# Patient Record
Sex: Male | Born: 1950 | Race: White | Hispanic: No | Marital: Married | Smoking: Current every day smoker
Health system: Southern US, Community
[De-identification: ages and names within clinical notes are randomized; demographics above are authoritative.]

## PROBLEM LIST (undated history)

## (undated) DIAGNOSIS — N529 Male erectile dysfunction, unspecified: Secondary | ICD-10-CM

## (undated) DIAGNOSIS — I2510 Atherosclerotic heart disease of native coronary artery without angina pectoris: Secondary | ICD-10-CM

## (undated) DIAGNOSIS — F419 Anxiety disorder, unspecified: Secondary | ICD-10-CM

## (undated) DIAGNOSIS — Z720 Tobacco use: Secondary | ICD-10-CM

## (undated) DIAGNOSIS — E785 Hyperlipidemia, unspecified: Secondary | ICD-10-CM

## (undated) DIAGNOSIS — N189 Chronic kidney disease, unspecified: Secondary | ICD-10-CM

## (undated) DIAGNOSIS — I10 Essential (primary) hypertension: Secondary | ICD-10-CM

## (undated) HISTORY — DX: Atherosclerotic heart disease of native coronary artery without angina pectoris: I25.10

## (undated) HISTORY — DX: Chronic kidney disease, unspecified: N18.9

## (undated) HISTORY — DX: Essential (primary) hypertension: I10

## (undated) HISTORY — DX: Male erectile dysfunction, unspecified: N52.9

## (undated) HISTORY — DX: Anxiety disorder, unspecified: F41.9

## (undated) HISTORY — DX: Hyperlipidemia, unspecified: E78.5

## (undated) HISTORY — DX: Tobacco use: Z72.0

---

## 1999-09-07 ENCOUNTER — Emergency Department (HOSPITAL_COMMUNITY): Admission: EM | Admit: 1999-09-07 | Discharge: 1999-09-07 | Payer: Self-pay | Admitting: Internal Medicine

## 2001-02-15 ENCOUNTER — Emergency Department (HOSPITAL_COMMUNITY): Admission: EM | Admit: 2001-02-15 | Discharge: 2001-02-15 | Payer: Self-pay | Admitting: Emergency Medicine

## 2002-11-22 ENCOUNTER — Inpatient Hospital Stay (HOSPITAL_COMMUNITY): Admission: EM | Admit: 2002-11-22 | Discharge: 2002-11-26 | Payer: Self-pay | Admitting: Emergency Medicine

## 2002-11-22 ENCOUNTER — Encounter: Payer: Self-pay | Admitting: Emergency Medicine

## 2002-11-23 ENCOUNTER — Encounter: Payer: Self-pay | Admitting: Interventional Cardiology

## 2002-11-23 ENCOUNTER — Encounter (INDEPENDENT_AMBULATORY_CARE_PROVIDER_SITE_OTHER): Payer: Self-pay | Admitting: Cardiology

## 2002-12-10 ENCOUNTER — Encounter (HOSPITAL_COMMUNITY): Admission: RE | Admit: 2002-12-10 | Discharge: 2003-03-10 | Payer: Self-pay | Admitting: Interventional Cardiology

## 2004-08-20 ENCOUNTER — Inpatient Hospital Stay (HOSPITAL_COMMUNITY): Admission: EM | Admit: 2004-08-20 | Discharge: 2004-08-22 | Payer: Self-pay | Admitting: Emergency Medicine

## 2004-09-21 ENCOUNTER — Encounter (HOSPITAL_COMMUNITY): Admission: RE | Admit: 2004-09-21 | Discharge: 2004-09-21 | Payer: Self-pay | Admitting: Interventional Cardiology

## 2005-06-23 ENCOUNTER — Inpatient Hospital Stay (HOSPITAL_COMMUNITY): Admission: EM | Admit: 2005-06-23 | Discharge: 2005-06-25 | Payer: Self-pay | Admitting: Emergency Medicine

## 2005-06-27 ENCOUNTER — Emergency Department (HOSPITAL_COMMUNITY): Admission: EM | Admit: 2005-06-27 | Discharge: 2005-06-27 | Payer: Self-pay | Admitting: Emergency Medicine

## 2007-11-06 ENCOUNTER — Ambulatory Visit: Payer: Self-pay | Admitting: Thoracic Surgery (Cardiothoracic Vascular Surgery)

## 2009-06-25 ENCOUNTER — Emergency Department (HOSPITAL_COMMUNITY): Admission: EM | Admit: 2009-06-25 | Discharge: 2009-06-26 | Payer: Self-pay | Admitting: Emergency Medicine

## 2010-12-01 ENCOUNTER — Encounter
Admission: RE | Admit: 2010-12-01 | Discharge: 2010-12-01 | Payer: Self-pay | Source: Home / Self Care | Attending: Family Medicine | Admitting: Family Medicine

## 2011-02-27 LAB — DIFFERENTIAL
Basophils Absolute: 0.1 10*3/uL (ref 0.0–0.1)
Basophils Relative: 1 % (ref 0–1)
Eosinophils Absolute: 0.2 10*3/uL (ref 0.0–0.7)
Eosinophils Relative: 2 % (ref 0–5)
Lymphocytes Relative: 17 % (ref 12–46)
Lymphs Abs: 1.6 10*3/uL (ref 0.7–4.0)
Monocytes Absolute: 0.5 10*3/uL (ref 0.1–1.0)
Monocytes Relative: 6 % (ref 3–12)
Neutro Abs: 7 10*3/uL (ref 1.7–7.7)
Neutrophils Relative %: 74 % (ref 43–77)

## 2011-02-27 LAB — BASIC METABOLIC PANEL
BUN: 8 mg/dL (ref 6–23)
CO2: 25 mEq/L (ref 19–32)
Calcium: 8.3 mg/dL — ABNORMAL LOW (ref 8.4–10.5)
Chloride: 102 mEq/L (ref 96–112)
Creatinine, Ser: 1.05 mg/dL (ref 0.4–1.5)
GFR calc Af Amer: >60 mL/min (ref >60)
GFR calc non Af Amer: >60 mL/min (ref >60)
Glucose, Bld: 99 mg/dL (ref 70–99)
Potassium: 3.4 mEq/L — ABNORMAL LOW (ref 3.5–5.1)
Sodium: 133 mEq/L — ABNORMAL LOW (ref 135–145)

## 2011-02-27 LAB — GLUCOSE, CAPILLARY: Glucose-Capillary: 86 mg/dL (ref 70–99)

## 2011-02-27 LAB — CBC
HCT: 42.2 % (ref 39.0–52.0)
Hemoglobin: 14.6 g/dL (ref 13.0–17.0)
MCHC: 34.6 g/dL (ref 30.0–36.0)
MCV: 94.5 fL (ref 78.0–100.0)
Platelets: 185 10*3/uL (ref 150–400)
RBC: 4.47 MIL/uL (ref 4.22–5.81)
RDW: 13.2 % (ref 11.5–15.5)
WBC: 9.5 10*3/uL (ref 4.0–10.5)

## 2011-02-27 LAB — POCT CARDIAC MARKERS
CKMB, poc: <1 ng/mL — ABNORMAL LOW (ref 1.0–8.0)
Myoglobin, poc: 92.5 ng/mL (ref 12–200)
Troponin i, poc: <0.05 ng/mL (ref 0.00–0.09)

## 2011-02-27 LAB — ETHANOL: Alcohol, Ethyl (B): 140 mg/dL — ABNORMAL HIGH (ref 0–10)

## 2011-04-09 NOTE — Discharge Summary (Signed)
[name], [name]                         ACCOUNT NO.:  [id number]   MEDICAL RECORD NO.:  [id number]                   PATIENT TYPE:  INP   LOCATION:  2036                                 FACILITY:  [facility]   PHYSICIAN:  [name], M.D.                DATE OF BIRTH:  07-29-1951   DATE OF ADMISSION:  11/22/2002  DATE OF DISCHARGE:  11/26/2002                                 DISCHARGE SUMMARY   PRIMARY CARE [name]:  Dr. Holley Davis.   PROCEDURE:  A.  (November 22, 2002) Stent x2 (Express II Monorail, mid and  distal right coronary artery).  B.  (November 23, 2002) 2-D echocardiogram revealing ejection fraction 55-65%  with regional wall motion abnormalities.  Negative pericardial effusion.  Negative significant valvular cardiac disease.  C.  Temporary transvenous pacemaker at time of heart catheterization for  bradycardia.   DISCHARGE DIAGNOSES:  1. Coronary atherosclerotic heart disease.     a. Acute inferior myocardial infarction complicated by        bradycardia/congestive heart failure with peak CK of 3147, MB fraction        391, troponin I of 68.3.  Stent x2,  mid/distal right coronary artery.        Residual 30-40% proximal and mid left anterior descending.  Preserved        ejection fraction by echocardiogram/catheterization.  2. Tobacco abuse; patient committed to smoking cessation.  3. History of anxiety disorder, on chronic b.i.d. Xanax dosing.  4. Re-perfusion dysrhythmias:  Improved/resolved during course of admission.     Did receive post-catheterization intravenous lidocaine and bolus.  5. Congestive heart failure related to inferior myocardial infarction:     Compensated by time of discharge.  6. Hypokalemia with initial serum potassium of 2.2, which was supplemented,     improved to 4.2 on January 2nd; 3.6 on day of discharge.   PLAN:  1. Patient discharged home in stable condition.  2. Discharge medications:     a. Plavix 75 mg on p.o. every day for  four weeks.     b. Nitroglycerin tablet 0.4 mg sublingual p.r.n. chest pain, up to 3 tabs        in 15 minutes.     c. Enteric-coated aspirin 325 mg per day.     d. Toprol-XL 50 mg p.o. every day.     e. Lisinopril 50 mg p.o. every day.     f. Xanax 1 mg p.o. b.i.d. as before.  3. Activity:  As outlined by cardiac rehab phase 2.  May shower.  Anticipate     phase 2 cardiac rehab as an outpatient.  4. Special instructions:  No driving for at least one week.  5. Followup:  Dr. Lyn Davis, Wednesday, January 14th, at 11:15 a.m.   BRIEF HOSPITAL COURSE:  The patient is a 60 year old gentleman presenting  with severe substernal chest pain starting approximately  30 minutes prior to  admission.  Status post nausea, vomiting, diaphoresis, shortness of breath,  and appeared pale with peripheral cyanosis.  EKG was consistent with acute  inferior MI, suspected right ventricular involvement.  The patient was  hypotensive and bradycardia.  Potassium was low at 2.2 and this was  supplemented.   Because of the severe bradycardia, rate 45 to 50, and hypotension, a  temporary pacemaker was placed through the right femoral vein into the right  ventricle.  Stent of the right coronary artery was found to be occluded at  midpoint with a large thrombus burden.  Patient underwent subsequent  PTCA/stents x2 of the mid and distal right coronary artery using two 3-mm  Express II stents.  Ejection fraction was fairly normal and was  approximately 50%.  Negative MR.  There was akinesis of a small area of the  mid-inferior wall.  LAD had mild-to-moderate irregularities between 30% and  40% in the proximal and mid-segments.  The circumflex had moderate  irregularities throughout.   The patient received Integrilin during the entire procedure as well as  status post heparin in the emergency room, with additional 1000 units in the  catheterization lab.   The first couple of days post procedure, the patient did  have reperfusion  dysrhythmias with runs of ventricular tachycardia up to 26 beats.  He was  given bolus of IV lidocaine with subsequent drip on November 24, 2002, no  further ectopy; IV lidocaine was discontinued.  He had been started on low-  dose Lopressor on November 24, 2002.   Patient ambulated up and about with cardiac rehab with good effect.  Smoking  cessation consult was obtained; patient appears motivated to quit.   Fasting lipid profile obtained and was in good range with cholesterol 147,  triglycerides 67, HDL 47, LDL of 87.  Dr. Francisca Davis deferred starting  a lipid agent as the LDL was quite low.  He felt patient would probably  benefit from repeat fasting statin profile as an outpatient with checking of  LDL particle size as well as LPA.   Low-dose ACE inhibitor was initiated, which the patient did tolerated  without excessive decreased blood pressure.  Low-sodium diet was reviewed as  well.   The patient did have excursions of chest discomfort improved with leaning  forward plus catheterization, though related to pericardial inflammation.  Two-dimensional echocardiogram was negative for pleural effusion and this  discomfort did resolve without further intervention.   At time of discharge, the patient was up and about, walking well without  problems and he was discharged home in stable condition.   PREVIOUS MEDICAL HISTORY:  1. History of coronary arteriosclerotic heart disease.     a. Cardiac catheterization four years earlier without intervention.Marland Kitchen     b. Two months earlier, stress Cardiolite which was negative for evidence        of ischemia.  2. Tobacco abuse, 1 pack per day for 35 years.  3. Anxiety, on Xanax b.i.d.  4. Impaired hearing with bilateral hearing aids.   LABORATORY TESTS AND DATA:  Admission WBC of 21.4, hemoglobin 15.1,  hematocrit of 43.1, platelets of 236,000, absolute neutrophils elevated at 16.3.  Pro time of 13.8, INR of 1.0, PTT 28.   Sodium 145, potassium of 2.2;  supplemented.  Potassium of 3.6 at time of discharge.  Chloride of 114, CO2  of 19, glucose 94, BUN of 10, creatinine 1.0.  Slightly elevated SGOT of 90,  slightly decreased  protein of 5.4, albumin 2.3, calcium of 7.6.  Magnesium  okay on January 2nd at 1.9.  First CK at 123, MB fraction 1.5, troponin I  not done; second CK of 3147, MB fraction 391.2, troponin I of 68.3; third CK  of 2962, MB fraction 256.8, troponin I of 59.28.  Cholesterol of 147,  triglycerides 67, LDH of 47, LDL of 87, VLDL of 13.   Chest x-ray from January 2nd revealed progression of congestive heart  failure with edema.   Admission EKG revealed findings consistent with acute inferior MI with ST  depression, V2 through V3, consistent with possible RV infarct as well as ST  segment elevation, II, III and aVF, consistent with inferior MI.   COMMENT:  Total time preparing discharge greater than 40 minutes including  dictating this discharge summary, counseling, filling out and reviewing  prescriptions for patient and calling in prescriptions, setting up followup  office visit.     Salomon Fick, N.P.                       [name], M.D.    MES/MEDQ  D:  11/26/2002  T:  11/26/2002  Job:  [id number]   cc:   [name], M.D.  [address]  [place], [place] [address]  Fax: [phone]

## 2011-04-09 NOTE — Consult Note (Signed)
[name], [name]               ACCOUNT NO.:  [id number]   MEDICAL RECORD NO.:  [id number]          PATIENT TYPE:  EMS   LOCATION:  MAJO                         FACILITY:  [facility]   PHYSICIAN:  Ollen Gross. Vernell Morgans, M.D. DATE OF BIRTH:  Jan 24, 1951   DATE OF CONSULTATION:  DATE OF DISCHARGE:                                   CONSULTATION   HISTORY OF PRESENT ILLNESS:  Mr. Brockbank is a 60 year old white male who  presents tonight with left lower quadrant pain that began this past Friday  after his heart catheterization.  Over the weekend the pain has not resolved  at all.  He has not run any fevers, not had any nausea and vomiting, no  diarrhea or dysuria, no chest pains or shortness of breath.  He has been  having normal bowel movements.  The pain he describes is in his thigh, left  groin and testicular area.  He has been eating and keeping food down.   PAST MEDICAL HISTORY:  Coronary artery disease.  Myocardial infarction.  Anxiety.   PAST SURGICAL HISTORY:  Cardiac stenting.   CURRENT MEDICATIONS:  Plavix. Lisinopril.  Xanax.   ALLERGIES:  PENICILLIN.   SOCIAL HISTORY:  He does smoke cigarettes.  Admits only occasional alcohol  use.   FAMILY HISTORY:  Noncontributory.   PHYSICAL EXAMINATION:  VITAL SIGNS:  Temperature 98.6, blood pressure  118/72, pulse 78.  GENERAL:  He is a well-developed, well-nourished white male in no acute  distress  SKIN:  Warm, dry with no jaundice.  HEENT:  Extraocular muscles intact.  Pupils equal, round, and reactive to  light.  Sclerae nonicteric.  LUNGS:  Clear bilaterally with no use of accessory muscles.  HEART:  Regular rate and rhythm with an impulse in the left chest .  ABDOMEN:  Soft with very mild left lower quadrant tenderness.  No guarding  or peritoneal signs.  No palpable mass or hepatosplenomegaly.  EXTREMITIES:  No clubbing, cyanosis, or edema with good strength in his arms  and legs.  NEUROLOGIC:  Psychologically he is alert and  oriented x3 with no evidence of  anxiety or depression.  GU:  He has exquisite tenderness around his left testicle.   LABORATORY DATA:  On review of his lab work his white count was normal with  no left shift.  UA was negative.  On reviewing his CT scan with the  radiologist his CT scan was unremarkable.  No evidence of inflammation of  the bowel.  No evidence of hernia.   ASSESSMENT AND PLAN:  This is a 60 year old white male with exquisite left  testicular pain.  He does not appear to have an intraabdominal process.  I  would recommend possibly a urologic evaluation for his testicular pain but  he does not appear to have any indication for abdominal surgery at this  time.  We will be happy to see him back any time as needed.  I appreciate  the opportunity of helping care for this patient.      Ollen Gross. Vernell Morgans, M.D.  Electronically Signed  PST/MEDQ  D:  06/28/2005  T:  06/28/2005  Job:  [id number]

## 2011-04-09 NOTE — Cardiovascular Report (Signed)
[name], [name] NO.:  [id number]   MEDICAL RECORD NO.:  [id number]          PATIENT TYPE:  INP   LOCATION:  4735                         FACILITY:  [facility]   PHYSICIAN:  Lyn Records, M.D.   DATE OF BIRTH:  10/22/1951   DATE OF PROCEDURE:  06/24/2005  DATE OF DISCHARGE:                              CARDIAC CATHETERIZATION   INDICATIONS FOR PROCEDURE:  Recurrent chest pain in this patient with a  prior history of inferior infarction, mid right coronary stent and distal  right coronary stent crossing the PDA with a non drug-eluting EXPRESS stent,  restenosis requiring angioplasty and drug-eluting stent implantation in the  PDA/distal RCA across the previously placed non-DES stent.   PROCEDURES PERFORMED:  1.  Left heart cath.  2.  Selective coronary angio.  3.  Left ventriculography.  4.  Percutaneous coronary intervention with cutting balloon angioplasty of      the continuation of the right coronary beyond the PDA and bifurcation      kiss post cutting balloon angioplasty.   DESCRIPTION:  After informed consent, a 6-French sheath was placed in the  right femoral artery using modified Seldinger technique. A 6-French A2  multipurpose catheter was then used for hemodynamic recordings, left  ventriculography by hand injection, right coronary angiography. A #4 left  Judkins catheter was used for left coronary angiography.   After review of the digital display, it was noted that the patient had 75-  80% stenosis in the continuation of the right coronary beyond the origin of  the PDA. This was in-stent restenosis within the previously placed EXPRESS  II non-drug-eluting stent.   HISTORY OF PRESENT ILLNESS:  After some contemplation, we decided to perform  cutting balloon angioplasty on the restenotic region. We used a Duostat  hemostatic valve, double bolus followed by an infusion of Angiomax, and  continue the patient on Plavix which he has been on for  nearly a year. We  used a BMW wire to cross into the distal right coronary LV branches and an  [employer]ahi soft wire to cross into the PDA. We then performed cutting balloon  angioplasty up to 10 atmospheres using a 10 mm long x 3.0 mm cutting  balloon.  Three balloon inflations were performed. We then performed a  follow-up kiss using 3.0 x 12 mm long Maverick balloons in both the PDA  and continuation branches overlapping the bifurcation with each balloon  inflated to 7 atmospheres. One kiss was performed and the case was  terminated. Good distal flow was noted. There was residual 30-40% stenosis  in the ostium of the LV branch beyond the bifurcation. TIMI grade 3 flow was  maintained throughout the procedure. ACT before starting intervention was  greater than 300 seconds.   CONCLUSION:  1.  Moderately severe restenosis in the continuation of the right coronary      beyond the posterior descending artery representing restenosis in a non-      drug-eluting stent placed in 2004.  2.  Successful cutting balloon angioplasty and follow up bifurcational      kiss with  reduction in 80% restenotic lesion to less than 40%.  3.  Moderate left anterior descending disease as outlined above with less      than 70% stenosis in the proximal/mid left anterior descending and      diffuse disease and second diagonal and mild to moderate disease in the      circumflex.  4.  Left ventricular dysfunction with inferior wall severe hypokinesis and      ejection fraction of 55%.  5.  Successful AngioSeal arteriotomy closure after demonstration of      appropriate anatomy by sheathogram.   PLAN:  Continue aspirin and Plavix. The patient is a high risk for distal  right coronary restenosis. If restenosis does occur, rather then putting an  additional stents, we will probably consider arterial coronary grafting with  mammary to the PD and LV branch rather than continued percutaneous  intervention.      Lyn Records, M.D.  Electronically Signed     HWS/MEDQ  D:  06/24/2005  T:  06/24/2005  Job:  [id number]   cc:   Holley Bouche, M.D.  [address]  [place], [place] [address]  Fax: [phone]

## 2011-04-09 NOTE — Discharge Summary (Signed)
NAMERISHAAN, GUNNER NO.:  [id number]   MEDICAL RECORD NO.:  [id number]          PATIENT TYPE:  INP   LOCATION:  6531                         FACILITY:  [facility]   PHYSICIAN:  Lyn Records, M.D.   DATE OF BIRTH:  [birth year]   DATE OF ADMISSION:  08/20/2004  DATE OF DISCHARGE:  08/22/2004                                 DISCHARGE SUMMARY   PRIMARY CARDIOLOGIST:  Lyn Records, M.D.   HISTORY OF PRESENT ILLNESS AND REASON FOR ADMISSION:  Mr. Stemen is a 60-  year-old male patient with known history of CAD, status post inferior MI in  January 2004, with stents to the RCA who presented to the ER on the date of  admission with complaints of chest pain, 5/10, related a four to five month  history of increasing fatigue, also reported increased significant personal  professional stressors, and intermittent bouts of chest pain with duration  less than five minutes intermittently over the past four to five months, no  apparent aggravating or alleviating factors.  He went out for dinner the  night before admission, developed profound nausea and sweats, drove home  which was about a 10 minute drive, felt near syncope, symptoms gradually  resolved as he sat in his car.  He was unable to sleep that night secondary  to ___________90% of the issue.  He took extra Xanax about 1 mg this a.m.  He had restated, and this is similar to the chest pain he has had over the  past four to five months, substernal, without radiation, and no other  associated symptoms.  He relates increased frequency bilateral  ____________with his chest pain, and later remembered that he was also  having radiation of his pain into the neck and jaw.  The patient had  received nitroglycerin and Toprol in the ER.  His blood pressure was 147/85,  heart rate of 88.  Initial cardiac markers were negative x1.  Other labs  were pending at time of admission.  His EKG showed sinus rhythm with small Q-  waves in the  inferior leads, ST elevation of V2 and V3 which was new.  Dr.  Amil Amen saw the patient initially and admitted him with the following  diagnoses:  Chest pain in a patient with known coronary artery disease,  anxiety disorder, noncompliance, tobacco abuse.   HOSPITAL COURSE:  CHEST PAIN AND KNOWN CORONARY ARTERY DISEASE:  The patient  was admitted to the telemetry unit where he was started on topical  nitroglycerin, his usual cardiac medications, including Toprol, Altace, and  aspirin.  He was anticoagulated with Lovenox q.12h., and serial cardiac  isoenzymes were obtained.  These all remained within normal limits.  EKG  also remained stable.  He was having some hypotension, so his nitroglycerin  patch was removed and blood pressure improved.  Due to his history of  tobacco abuse, cessation counseling was ordered.  Dr. Fraser Din at Dr. Amil Amen  request due to some scheduling issues, discussed with the patient the need  for coronary angiography and this was performed by her on August 21, 2004.  At that time, the cath revealed that the previously stented in the  RCA was patent, the PDA had a 70 to 90% lesion depending on the frame you  were viewing it in.  This was compared to prior catheterization of January  2005, and this appeared to be a progression of disease.  Dr. Amil Amen also  reviewed the films and Taxus stenting of the PDA was performed as well as  PTCA of a LV branch.  The patient tolerated the procedure well.  He did have  some ST segment elevation during the procedure, but the patient was sedated,  therefore no complaints of chest pain.  The following morning, Dr. Katrinka Blazing had  returned and was rounding on the patient.  He had complained of chills the  night before without cough or chest pain.  His blood pressure was slightly  low at 90/60.  He had a low-grade temperature of 100.9, white count was  7200, creatinine was 1.1.  Based on clinical examination, Dr. Katrinka Blazing was  suspicious  for a possible right lower lobe infiltrate.  He checked a chest x-  ray which showed no elevation of pneumonia, and by the following day the  patient was afebrile, lungs were clear, and he was cleared for discharge  home.   FINAL DISCHARGE DIAGNOSES:  1.  Chest pain in a patient with known coronary artery disease, status post      Taxus stent to posterior descending artery and left ventricular branch      percutaneous transluminal coronary angioplasty.  2.  Prior stent to the right coronary artery, patent per this      catheterization.  3.  Chills, low-grade fever, with a rapid resolution.  No pneumonia seen on      chest x-ray.  4.  Anxiety disorder, on chronic Xanax.  5.  Continued tobacco abuse.  6.  History of medication noncompliance.   DISCHARGE MEDICATIONS:  1.  Aspirin 325 mg daily.  2.  Plavix 75 mg daily.  3.  Nitroglycerin 0.4 mg sublingual p.r.n. chest pain.  4.  Lisinopril 10 mg daily.  5.  Xanax as previous.   ACTIVITY:  As tolerated.   DIET:  Low fat.   FOLLOWUP:  He was instructed to call Dr. Michaelle Copas office in one to two weeks  to arrange a follow up appointment.       ALE/MEDQ  D:  10/01/2004  T:  10/02/2004  Job:  [id number]   cc:   Holley Bouche, M.D.  [address]  [place], [place] [address]  Fax: [phone]

## 2011-04-09 NOTE — Discharge Summary (Signed)
NAMEDEVERICK, PRUSS NO.:  [id number]   MEDICAL RECORD NO.:  [id number]          PATIENT TYPE:  INP   LOCATION:  6526                         FACILITY:  [facility]   PHYSICIAN:  Lyn Records, M.D.   DATE OF BIRTH:  05-18-1951   DATE OF ADMISSION:  06/23/2005  DATE OF DISCHARGE:  06/25/2005                                 DISCHARGE SUMMARY   PRIMARY CARE PHYSICIAN:  Holley Bouche, M.D.   CHIEF COMPLAINT/REASON FOR ADMISSION:  Mr. Kerschner is a 61 year old male  patient with prior history of myocardial infarction in 2004 with subsequent  stents to the mid and distal RCA.  He was re-admitted in September 2005 for  complaint of chest pain.  Underwent subsequent cutting balloon percutaneous  transluminal coronary angioplasty of  the proximal POB.  Essentially has  been taking medications as prescribed, but apparently has not followed up  with Dr. Katrinka Blazing in the office except for the immediate post percutaneous  transluminal coronary angioplasty period in September 2005.  He presented to  the emergency room on date of admission with left anterior chest pain that  was nonradiating occasionally associated with shortness of breath or  diaphoresis.  This was usually nocturnal in nature, rest associated, no  exertional symptoms.  Unfortunately, the issue was clear about the fact the  patient is also reporting an increase in stress over the same period of  time; i.e., six weeks that the chest pain has been occurring.  He also does  not have a current bottle of nitroglycerin and was using Xanax for the  discomfort, and the Xanax was helping him go to sleep, but he would awaken  with chest pain.  Because of patient's symptoms, he was admitted with both  typical and atypical features of unstable angina with plans to cath in the  morning.   ADMISSION DIAGNOSES:  1.  History of inferior myocardial infarction in 2004 with prior stents to      the mid and distal right coronary artery  as well as cutting balloon      percutaneous coronary intervention to the proximal posterior lateral      branch in 2005.  2.  Recurrent chest discomfort with typical and atypical features concerning      arrest features.  3.  Anxiety disorder on Xanax followed by Dr. Tiburcio Pea.  4.  Continued tobacco abuse.  5.  History of prior decrease left ventricular systolic function after 2004      myocardial infarction with ejection fraction 30-40%.  Echo in 2005      showed ejection fraction back up to 55-65%.   HOSPITAL COURSE:  Chest pain, history of two-vessel coronary artery disease.  The patient was admitted to the telemetry unit where he was started on  routine cardiac isoenzymes.  These were negative.  EKGs were negative as was  his initial EKG.  He was started on Lovenox 1 mg/kg subcu q.12h.  He was  continued on his lisinopril, Plavix, and aspirin as well as he was given  Xanax as per his home regimen to help treat the  anxiety portion of symptoms.  He was taken to the cath lab on June 24, 2005 by Dr. Katrinka Blazing where the LAD  was found to have a 60% proximal mid, diagonal #2 90%, and RCA 80% ostial.  Left ventricular dysfunction was found to be borderline at 50%, but was  associated with severe inferior hypokinesis.  The patient subsequently  underwent PCI and drug-eluting stent implantation to the RCA.  This was felt  to be an in-stent restenosis.  Was also considered to have moderate LAD and  diagonal disease.  He was continued on aspirin and Plavix.   Dr. Katrinka Blazing as well as myself have discussed extensively risk factor  modification with this patient including most important risk factor for him  to modify which would be the smoking issue.  Both patient and wife agree  that patient needs to quit smoking.  Wife also smokes and she is in the  process of smoking cessation as well.  Wife also expressed on multiple  occasions concerns that husband's stressful job is also contributing to   progression of cardiac disease and elucidation of symptoms.  They are  planning on following up with Dr.  Tiburcio Pea for further medical management of  anxiety disorder and possible referral to psychiatrist.  Wife believes that  patient would benefit from psychiatric disability at this point.   On date of discharge from a physical exam standpoint, the patient was  stable.  His right groin was unremarkable.  Blood pressure was 110/67, heart  rate 70, and he was deemed appropriate for discharge home per Dr. Katrinka Blazing.   FINAL DISCHARGE DIAGNOSES:  1.  Chest pain and unstable angina secondary to in-stent restenosis of the      right coronary artery.  2.  Moderate left anterior descending and diagonal disease.  3.  Mildly decreased left ventricular systolic function with an ejection      fraction of 50%, but with associated severe inferior hypokinesis.  4.  Continued tobacco abuse.  5.  Anxiety disorder.   DISCHARGE MEDICATIONS:  1.  Nicotine CQ 21 mg patch daily.  Obtain over-the-counter.  Use the      tapering regimen.  2.  Lisinopril 10 mg daily.  3.  Plavix 75 mg daily.  The patient was on this prior to admission, but I      will write a prescription in case a refill is needed.  4.  Wellbutrin 150 mg daily.  5.  Aspirin 325 mg daily.   DIET:  Heart healthy.   ACTIVITY:  Increase activity slowly.  Shower only for the next two days and  no lifting more than 10 pounds for the next two weeks.   ADDITIONAL INSTRUCTIONS:  Stop smoking.  Try to decrease by one cigarette  every one to two weeks until stopped.  Recommend smoking cessation  outpatient classes as well for the patient.  These are available at [facility].   FOLLOWUP APPOINTMENTS:  He needs to call Dr. Tiburcio Pea for an appointment to be  seen in the next one the two weeks regarding psychiatric evaluation.  He is  to follow up with Dr. Katrinka Blazing in the office on August 25 at 10:15 a.m.     Allison L. Rennis Harding, N.P.      Lyn Records, M.D.  Electronically Signed   ALE/MEDQ  D:  06/25/2005  T:  06/25/2005  Job:  [id number]   cc:   Holley Bouche, M.D.  [address]  [place], [place] [address]  Fax: [phone]

## 2011-04-09 NOTE — H&P (Signed)
NAMESHEPPARD, LUCKENBACH                         ACCOUNT NO.:  [id number]   MEDICAL RECORD NO.:  [id number]                   PATIENT TYPE:  INP   LOCATION:  2852                                 FACILITY:  [facility]   PHYSICIAN:  Armanda Magic, M.D.                  DATE OF BIRTH:  1951/10/28   DATE OF ADMISSION:  11/22/2002  DATE OF DISCHARGE:                                HISTORY & PHYSICAL   CHIEF COMPLAINT:  Chest pain.   HISTORY OF PRESENT ILLNESS:  This is a 60 year old white male with a history  of coronary disease in the past.  No available chart at present.  He  presented to the emergency room with complaints of severe substernal chest  pain starting approximately 30 minutes ago.  The patient apparently was  cleaning out his Jeep for about two and one-half hours when the pain  occurred.  Currently he is pale and diaphoretic with peripheral cyanosis.  He did complain of nausea, vomiting, diaphoresis, and shortness of breath.   PAST MEDICAL HISTORY:  Significant for coronary disease with catheterization  four years ago and anxiety.  Apparently he has been having some intermittent  chest pain and saw Dr. Katrinka Blazing several months ago and did a stress test which  was normal.   PAST SURGICAL HISTORY:  None.   SOCIAL HISTORY:  Positive for tobacco one pack per day for 35 years,  occasional alcohol use.  He is married with two children and two step  children.   FAMILY HISTORY:  Noncontributory.   MEDICATIONS:  Include Xanax and aspirin.   REVIEW OF SYSTEMS:  Positive for leg cramps, positive for chest tightness  episodically for several years, otherwise negative Review of Systems.   PHYSICAL EXAMINATION:  VITAL SIGNS:  Blood pressure currently 124/84, but in  the ER, he had systolic blood pressure in the 70s.  GENERAL:  Well developed, well nourished white male in moderate distress  secondary to chest pain.  HEENT:  Benign.  NECK:  Supple.  LUNGS: Clear to auscultation  throughout.  HEART:  Regular rate and rhythm.  No murmurs, rubs, or gallops.  Normal S1  and S2.  ABDOMEN:  Soft, nontender, nondistended, with active bowel sounds.  No  hepatosplenomegaly.  EXTREMITIES:  No erythema.  Positive cyanosis of fingers and toes.  Trace  distal pulses.   LABORATORY DATA:  Sodium 145, potassium 2.2, chloride 114, bicarb 13,  glucose 94, creatinine 0.7.  Hematocrit 31, hemoglobin 11.   EKG shows sinus bradycardia.  Acute inferior MI with ST depression in V2  through V3 consistent with possible RV infarct as well as ST segment  elevation in II, III, and aVF consistent with inferior MI.    ASSESSMENT:  1. Acute inferior myocardial infarction with possible right ventricular     involvement.  2. History of coronary disease status post percutaneous transluminal  coronary angioplasty four years ago.  3. Hypokalemia.   PLAN:  1. Emergent catheterization.  2. Admit to CCU.  3. Follow cardiac enzymes q.8h. for 24 hours.  4. No beta blockers or nitrates secondary to hypotension and bradycardia.  5. Check fasting lipid panel.  6. Replete potassium.                                               Armanda Magic, M.D.    TT/MEDQ  D:  11/22/2002  T:  11/22/2002  Job:  [id number]

## 2011-04-09 NOTE — Cardiovascular Report (Signed)
[name], [name]               ACCOUNT NO.:  [id number]   MEDICAL RECORD NO.:  [id number]          PATIENT TYPE:  INP   LOCATION:  6531                         FACILITY:  [facility]   PHYSICIAN:  Meade Maw, M.D.    DATE OF BIRTH:  Feb 23, 1951   DATE OF PROCEDURE:  08/21/2004  DATE OF DISCHARGE:  08/22/2004                              CARDIAC CATHETERIZATION   INDICATION FOR PROCEDURE:  Known coronary artery disease, recurrent angina.   PROCEDURE:  After obtaining written informed consent, the patient was  brought to the cardiac catheterization lab in a postabsorptive state.  Preop  sedation was achieved using Valium 10 mg p.o., Versed 4 mg IV, and Fentanyl  50 mcg.  The right groin was prepped and draped in the usual sterile  fashion.  Local anesthesia was achieved using 1% Xylocaine.  A 6 French  hemostasis sheath was placed into the right femoral artery using a modified  Seldinger technique.  Selective coronary angiography was performed using a  JL-4, JR-4 Judkins catheter.  Multiple views were obtained.  All catheter  exchanges were made over guide wire.  Single plane ventriculogram was  performed in the RAO position using a 6 French angled pigtail catheter.  All  catheter exchanges were made over guide wire.  Hemostasis sheath was flushed  following each catheter exchange.   FINDINGS:  1.  Aortic pressure was 96/56.  2.  LV pressure 93/3 with EDP of 7.  3.  Single plane ventriculogram revealed inferior basilar hypokinesis.      Estimated ejection fraction is 55-60%.  There is mitral regurgitation      noted.   CORONARY ANGIOGRAPHY:  1.  The left main coronary artery bifurcates into the left anterior      descending and circumflex vessel.  There is no disease noted in the left      main coronary artery.  2.  LAD:  LAD gives rise to a large bifurcating D1, smaller D2, small D3 and      goes on in as an apical recurrent branch.  There are  left-to-right collaterals noted  from the left anterior descending.  There  are irregularities throughout the left anterior descending of up to 30-40%.  1.  Circumflex vessel:  Circumflex is a moderate size vessel.  It gives rise      to trivial OM-1.  There are luminal irregularities in the circumflex of      up to 30-40%.  2.  Right coronary artery:  The right coronary artery is a large dominant      artery.  It gives rise to an RV marginal 1, RV marginal 2, large PDA and      a large trifurcating PL branch.  The previously stented region in the      right coronary artery remains patent.  There is a 70-90% lesion in the      PDA depending upon what view.  This was compared to the previous film.      The PDA was not well demonstrated on his cath of January 2005, but there  appears to be progression of the PDA lesion.  The films will be reviewed      with Dr. Amil Amen.  Further intervention pending his review.      Hele  HP/MEDQ  D:  08/21/2004  T:  08/22/2004  Job:  [id number]

## 2011-04-09 NOTE — Cardiovascular Report (Signed)
NAMEJASSIAH, [name]               ACCOUNT NO.:  [id number]   MEDICAL RECORD NO.:  [id number]          PATIENT TYPE:  INP   LOCATION:  6531                         FACILITY:  [facility]   PHYSICIAN:  Francisca December, M.D.  DATE OF BIRTH:  1951/08/08   DATE OF PROCEDURE:  08/21/2004  DATE OF DISCHARGE:  08/22/2004                              CARDIAC CATHETERIZATION   PROCEDURE:  Percutaneous coronary intervention.   PROCEDURES PERFORMED:  1.  Percutaneous coronary intervention/drug-eluting stent implantation,      proximal posterior descending artery.  2.  Cutting balloon angioplasty proximal posterolateral branch.   INDICATIONS:  [name] is a 60 year old man with known ASCVD now  approximately 1-3/4 years S/P acute inferior wall MI, S/P PCI /stent  implantation mid RCA and PLB.  He has been readmitted to the hospital with  atypical angina.  Repeat coronary angiography revealed a 90% stenosis of the  ostium of a moderate to large-sized posterior descending artery, and some  end-stent restenosis in the PLB stent.  He is to undergo catheter-based  revascularization at this time.   PROCEDURAL NOTE:  Via the previously placed 6 French catheter sheath, a 6  [place] #4 right Judkins guiding catheter was advanced to the ascending aorta  where the right coronary ostia was engaged.  A 0.014 inch SciMed Luge  intracoronary guidewire was passed across the lesion in the posterolateral  segment without difficulty.  This was after 3900 units of heparin  intravenously and an ACT confirmed of 268 seconds.  He also received a  double bolus of Integrilin in constant infusion.  A second SciMed Luge  intracoronary wire was passed across the lesion of the proximal PDA without  difficulty.  Initial dilatation of the end-stent restenosis and the  posterolateral segment stent was dilated using a 3.0/15 millimeter cutting  balloon.  This was inflated to 9 atmospheres on three occasions for about 2  minutes each.  This balloon was deflated and removed.  A 2.5/15 millimeter  SciMed Maverick intracoronary balloon was then advanced in the posterior  descending ostium and inflated to 10 atmospheres for approximately 1 minute.  This balloon was deflated and removed, and a 2.75/16 millimeter SciMed Taxus  drug-eluting stent was advanced into the proximal PDA and distal right  coronary artery.  This was deployed to peak pressure of 16 atmospheres for  approximately 1 minute.  At this point the previously placed Luge wire in  the PLB was removed, and then readvanced.  The 2.75/15 millimeter Maverick  was advanced into the PLB through the recently placed stent struts and  inflated to 8 atmospheres for 1 minute.  This balloon was deflated and  removed and the wire was withdrawn and advanced into the PDA, and again the  Maverick balloon advanced into the proximal PDA and inflated there to 4  atmospheres for about 30 seconds.  The ballon and the wire were then  removed.  Adequate patency was confirmed on orthogonal views.  The guiding  catheter was then removed.  A right femoral arteriogram was performed in a  45 degree RAO angulation, confirming  the arteriotomy site to be above the  bifurcation into the profunda femoris and superficial femoral arteries.  Angioseal percutaneous closure was then deployed successfully.  The patient  was transported to the recovery area in stable condition with an intact  distal pulse.   Angiography as mentioned, the lesions treated were in the posterolateral  segment proximally and in the ostium of the posterior descending artery.  Following cutting balloon dilatation, and plain balloon dilatation of the  posterolateral segment, there was about a 10-15% residual stenosis.  Following balloon dilatation and stent implantation in the ostium of the  posterior descending artery, there was no residual stenosis.   FINAL IMPRESSION:  1.  Atherosclerotic cardiovascular  disease, single-vessel.  2.  Status post successful percutaneous coronary intervention and drug-      eluting stent implantation distal right coronary artery and proximal      posterior descending artery.  3.  Successful cutting balloon percutaneous transluminal coronary      angioplasty posterolateral segment.  4.  Typical angina was not reproduced with device insertion of balloon      inflation, although ST segment elevation was seen.       JHE/MEDQ  D:  08/21/2004  T:  08/22/2004  Job:  [id number]   cc:   Lyn Records III, M.D.  [address]  [address]  [place]  [place] [address]  Fax: [phone]

## 2011-04-09 NOTE — Cardiovascular Report (Signed)
[name], [name]                         ACCOUNT NO.:  [id number]   MEDICAL RECORD NO.:  [id number]                   PATIENT TYPE:  INP   LOCATION:  2852                                 FACILITY:  [facility]   PHYSICIAN:  [name], M.D.              DATE OF BIRTH:  12-Feb-1951   DATE OF PROCEDURE:  DATE OF DISCHARGE:                              CARDIAC CATHETERIZATION   INDICATIONS:  The patient is a 60 year old gentleman with a history of  coronary artery disease in the past.  He presents with acute onset of chest  pain for approximately one hour.  He presented with severe hypotension with  blood pressure in the 60 to 70 range, bradycardia with a heart rate of 45 to  50, and a cardiogenic shock.  He was also found to have acute ST segment  elevation in the inferior leads.  He was brought to the catheterization lab  for further evaluation.   DESCRIPTION OF PROCEDURE:  The right femoral artery and the right femoral  veins were easily cannulated using the modified Seldinger technique.  Because the severe bradycardia and hypotension, and a temporary pacemaker  was placed up through the right femoral vein into the right ventricle.  We  were able to get a very nice threshold of 0.2 MA.  We set the pacer at a  rate of 70 with 0.2 MA.   The right femoral artery was cannulated using a 7 [place] sheath.   FINDINGS:  Angiography:  The right coronary artery was found to be occluded  at its midpoint.  There was a very large thrombus burden.   Angiography of the left coronary system revealed a fairly normal left main.  The left anterior descending artery had mild to moderate irregularities  between 30% and 40% in the proximal and midsegments.  There were several  moderate-sized diagonal branches, which had only minor luminal  irregularities.  The left circumflex artery was a moderate-sized vessel.  There were moderate irregularities throughout the circumflex artery.   Left  ventriculogram:  The left ventriculogram was performed actually after  the angioplasty.  It revealed a mildly depressed left ventricular systolic  function with akinesis of a small area of the mid-inferior wall.  Ther  overall injection fraction was fairly normal with an ejection fraction of  approximately 50%.  There was no mitral regurgitation.   PTCA:  The right coronary artery was engaged using a Judkins right-4 7  French side-hole guide.  A Patriot guide wire was used to cannulated the  right femoral artery and posterior descending artery.  A 3.0 by 20 mm  Quantum Maverick was placed down across the stenosis and one inflation was  performed up to 2 atmospheres.  This resulted in good antegrade flow but  with significant hypotension and bradycardia.  The pacemaker, which had been  set on the demand rate, started pacing.  His blood pressure  fell back down  into the 65 range.  We started dopamine at 5 mcg/kg per minute at that  point.  We performed repeated balloon inflations with very quick deflations  to allow the metabolites to wash out slowly.  We performed a total of six  inflations in the mid and distal right coronary artery.  The patient had a  brief episode of ventricular fibrillation during one the deflations which  resolved either spontaneously or in response to the ventricular pacing.  He  did not require cardioversion or a precordial thump.  The patient never lost  consciousness.   The patient continued to stabilize.  Follow-up angiography at this point  revealed a very tight stenosis in the posterolateral artery of approximately  80-90%.  The posterior descending artery overall looked fairly good.  There  was also found that there was a 70-80% stenosis in the mid/distal right  coronary artery.  The Maverick and the wire were advanced down into the  posterolateral segment artery and one inflation was performed at 6  atmospheres for one minute.  At this point, a 3.0 by 16 mm  Express stent was  placed down distally and was placed across the bifurcation of the posterior  descending artery and posterior lateral segment artery.  It was deployed at  14 atmospheres for 37 seconds.  This resulted in a very nice angiographic  result of a distal vessel.  This balloon was removed and a 3.0 by 20 mm  Express II was positioned in the mid-lesion.  It was deployed at 16  atmospheres for 30 seconds.  This resulted in a very nice angiographic  result with 0% residual stenosis.  The patient's blood pressure was in the  90 to 95 range during this time and the patient was basically asymptomatic.   The patient had been given Integrilin during the entire procedure. The  patient had also received 5000 units of heparin down in the emergency room  and received an additional 1000 units of heparin here in the catheterization  lab.   COMPLICATIONS:  None.   CONCLUSIONS:  1. Significant inferior wall myocardial infarction complicated by     cardiogenic shock, bradycardia, and a brief episode of ventricular     fibrillation.  2. Successful percutaneous transluminal coronary angioplasty and stenting of     the distal and mid-right coronary artery using two 3 mm Express II     stents.  3. Mildly depressed left ventricular systolic function due to this     myocardial infarction.   DISPOSITION:  The patient was admitted to the CCU in satisfactory condition.  We will start him on ACE inhibitors and beta blockers in the morning.  I do  not think that he will tolerate beta blocker and ACE inhibitor tonight.  He  will be followed by Dr. Armanda Davis and [name].                                                  [name], M.D.    PJN/MEDQ  D:  11/22/2002  T:  11/22/2002  Job:  [id number]   cc:   [name] III, M.D.  [address]  [address]  [place]  [place] [address]  Fax: [phone]

## 2011-04-09 NOTE — H&P (Signed)
[name], [name]               ACCOUNT NO.:  [id number]   MEDICAL RECORD NO.:  [id number]          PATIENT TYPE:  INP   LOCATION:  3711                         FACILITY:  [facility]   PHYSICIAN:  Francisca December, M.D.  DATE OF BIRTH:  1951-04-27   DATE OF ADMISSION:  08/20/2004  DATE OF DISCHARGE:                                HISTORY & PHYSICAL   PHYSICIANS:  1.  Holley Bouche, M.D., primary care physician.  2.  Lyn Records, M.D., primary cardiologist.  3.  Francisca December, M.D., consulting cardiologist.   CHIEF COMPLAINT:  Chest pain.   HISTORY OF PRESENT ILLNESS:  Chest pain.   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 The course  was complicated by exacerbation of CHF and bradycardia.  LVEF during  catheterization was 30-40%.  At 24 hours, echocardiogram revealed EF of 55-  65%.   The patient presented to the ER today with complaint of chest pain graded of  5/10, currently down to 0/10.  He relates a four to five month symptom of  atypical symptoms of increasing fatigue, occasional chest pain that usually  lasts less than five minutes without any associated symptoms.  This chest  pain has no apparent aggravating or ameliorating factors and no associated  symptoms with it.  He is also complaining of significant  personal/professional stressors over a period of years.   According to the patient yesterday evening, he went out to dinner with his  wife.  Immediately following dinner, he became profoundly nauseated and  began sweating somewhat.  His wife drove him home, about a 10 minute drive.  The patient began to feel near syncopal, like he was going to pass out, but  by the time they arrived home, his symptoms had gradually resolved.  He  states he was unable to sleep that evening, not related to chest  pain or  shortness of breath but due to increasing anxiety which he reported as 90%  worry.  He states he took an extra Xanax this morning about an extra 1 to  1.5 mg.  Earlier this morning, began experiencing similar chest pain to the  episodes he has been having intermittently over the past four to five  months.  He describes this initially as being substernal chest pain without  any radiation or associated symptom.  He has noticed recently some bilateral  hand numbness, usually associated with chest pain.  When prompted with other  typical cardiac symptoms, this patient was very nonspecific in complaints  and would continually go off on a tangent during history taking.  The  patient became more specific in his complaints, i.e. I asked him if he had  any radiation of pain into the neck or into the jaw.  He said oh yes, I've  been having that.   After arrival to the ER, the patient did receive nitroglycerin and Toprol  and has had  subsequent resolution of his symptoms.   REVIEW OF SYMPTOMS:  Again, the patient is mainly reporting significant  problems with anxiety for years. 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  He travels frequently with  his job and relates that his job is very stressful and takes up many hours.  He is denying any constitutional symptoms, no respiratory symptoms.  He  states he has never been able to lay flat to sleep.  He has no GI symptoms  such as reflux or change in bowel such as dark or bloody stools,  constipation or diarrhea.  He has no urinary symptoms.  He has no lower  extremity edema.  He reports that he has gained about six to seven pounds in  the past few years of weight.   PAST MEDICAL HISTORY:  1.  Inferior MI in January 2004.  2.  Status post Express II stent x 2 to the distal and mid-RCA in 2004.  3.  EF 30-40% per catheterization with subsequent EF 55-65% per      echocardiogram.  4.   Tobacco abuse for 36 years.  5.  Anxiety disorder on Xanax.  6.  Post-[facility] with no recurrences after discharge.   FAMILY HISTORY:  Noncontributory.   SOCIAL HISTORY:  Tobacco one pack per day for 36 years.  Alcohol at two to  three drinks, anywhere from once to three times per week.  He is married.  Has several children.  Works in a high stress job in Programmer, multimedia, Insurance account manager, and Airline pilot.   ALLERGIES:  PENICILLIN which causes rash and itching.   MEDICATIONS:  1.  Xanax 1 mg 1/2 tablet q.a.m., noon, and q.p.m.  2.  Aspirin 325 mg q.d.  3.  Multiple vitamins over-the-counter.  4.  Altace 2.5 mg q.d.  5.  Toprol XL 25 mg 1/2 q.d.  The patient has not consistently taken these      medications in the past according to our office records and when I      questioned him about this he states that he is essentially taking      samples and because of this he usually only takes his medications about      every two to three days.   PHYSICAL EXAMINATION:  GENERAL:  This is an anxious male in no acute  distress.  When I first entered the room, he requested 2 mg of Xanax for his  nerves.  Currently denies active chest pain.  VITAL SIGNS:  Temperature 98.5, blood pressure 147/85, heart rate 88,  respirations 16.  NEUROLOGICAL:  The patient is alert and oriented x 3, moving all extremities  x 4.  No focal neurological deficits.  HEENT:  Head is normocephalic.  NECK:  Supple.  There is no adenopathy.  CHEST:  Clear to auscultation posteriorly.  Respiratory effort is not  labored.  He is currently on two liters nasal cannula O2.  HEART:  S1 and S2 without JVD.  Carotids are 2+ bilaterally without bruits.  He is normal sinus rhythm on the bedside telemetry monitor.  ABDOMEN:  Soft.  Bowel sounds are present.  Abdomen is nontender with  palpation.  There is no obvious hepatosplenomegaly.  No bruits and no abdominal hernias.  EXTREMITIES:  Symmetrical in appearance  without edema, cyanosis, or  clubbing. There is no small or large joint effusions or erythema noted.  PULSES:  Easily  palpable bilaterally at 2+ radial, femoral and pedal.   LABORATORY DATA:  Currently we only have one set of point of care markers  back.  Myoglobin 61.7, MB 1.1, troponin I less than 0.05.  Chemistry, CBC,  and coagulase panels are pending at the time of dictation.   An EKG shows sinus rhythm.  Very small Q-waves in the inferior leads  consistent with prior MI.  There is very subtle ST segment elevation in V2  and V3, which is slightly changed from 2004 EKGs but these are essentially  nonspecific in nature.  Portable chest x-ray is pending at the time of this  dictation.   IMPRESSION:  1.  Chest pain with known coronary artery disease, prior stents.  2.  Anxiety disorder.  3.  Medical noncompliance.  4.  Continued tobacco abuse.   PLAN:  We will admit the patient for telemetry monitoring and continued  serial cardiac isoenzymes.  He again is presenting with atypical symptoms,  but with his history of CAD, this is quite concerning.  He has a history of  negative Cardiolite in the past prior to his previous MI and he is  requesting diagnostic catheterization in lieu of proceeding with an  additional Cardiolite study.  Dr. Francisca December has spoken to the patient  and he agrees to perform catheterization on this patient and this will be  done in the morning.  We will begin with Lovenox subcutaneously 1 mg per kg  q.12h. per pharmacy dosing.  Continue aspirin, Toprol, and ACE inhibitor as  per home doses.  We will also add topical nitroglycerin.   With his anxiety disorder, we will need to resume his Xanax.  I have  increased his p.m. dosing from 1/2 to 1 mg while he is an inpatient.  Consider adding an antidepressant.  The patient has not been filling his  prescription and is living off samples and takes medications inconsistently.  Unclear if this is due to financial  concern.  May need to have care  management consult while inpatient.  We will add a nicotine patch 14 mg  daily to help this patient with his smoking.  He states that in the past he  has used smoking as a method of dealing with his stress.       ALE/MEDQ  D:  08/20/2004  T:  08/20/2004  Job:  [id number]   cc:   Holley Bouche, M.D.  [address]  [place], [place] [address]  Fax: [phone]

## 2013-05-22 DEATH — deceased

## 2013-09-06 ENCOUNTER — Ambulatory Visit: Payer: Self-pay | Admitting: Interventional Cardiology

## 2013-09-11 ENCOUNTER — Encounter: Payer: Self-pay | Admitting: Interventional Cardiology

## 2013-09-11 ENCOUNTER — Encounter: Payer: Self-pay | Admitting: *Deleted

## 2013-09-14 ENCOUNTER — Ambulatory Visit: Payer: Self-pay | Admitting: Interventional Cardiology

## 2013-09-25 ENCOUNTER — Ambulatory Visit (INDEPENDENT_AMBULATORY_CARE_PROVIDER_SITE_OTHER): Payer: Medicare Other | Admitting: Interventional Cardiology

## 2013-09-25 ENCOUNTER — Encounter: Payer: Self-pay | Admitting: Interventional Cardiology

## 2013-09-25 VITALS — BP 142/72 | Ht 68.0 in | Wt 173.0 lb

## 2013-09-25 DIAGNOSIS — E785 Hyperlipidemia, unspecified: Secondary | ICD-10-CM

## 2013-09-25 DIAGNOSIS — F17200 Nicotine dependence, unspecified, uncomplicated: Secondary | ICD-10-CM

## 2013-09-25 DIAGNOSIS — I10 Essential (primary) hypertension: Secondary | ICD-10-CM

## 2013-09-25 DIAGNOSIS — Z720 Tobacco use: Secondary | ICD-10-CM

## 2013-09-25 DIAGNOSIS — I2510 Atherosclerotic heart disease of native coronary artery without angina pectoris: Secondary | ICD-10-CM

## 2013-09-25 MED ORDER — LISINOPRIL-HYDROCHLOROTHIAZIDE 20-12.5 MG PO TABS: 1.0000 | ORAL_TABLET | Freq: Every day | ORAL | Status: DC

## 2013-09-25 NOTE — Progress Notes (Signed)
Patient ID: [name], male   DOB: 12/16/1950, 62 y.o.   MRN: [id number]    [address] [place], [place]  [address] Phone: [phone] Fax:  [phone]  Date:  09/25/2013   ID:  [name], DOB 11-29-1950, MRN [id number]  PCP:  No primary provider on file.   ASSESSMENT:  1. Coronary artery disease, stable without angina 2. Blood pressure, poorly controlled 3. Hyperlipidemia on therapy 4. Tobacco abuse, continuous  PLAN:  1. Change Prinivil 20 mg per day to Prinzide 20/12.5 mg per day 2. Followup with blood pressure check with NP in one month 3. Encouraged smoking cessation   SUBJECTIVE: [name] is a 62 y.o. male is accompanied by his wife. It is difficult to tell exactly how he is doing because of his tendency to be tangential and conversation. He is very loquacious. He smokes despite many previous conversations encouraging cessation. He has not had angina. He has no limitations with physical activity. He denies angina and nitroglycerin use. He is under stress related to having to care for his elderly mother. [place]   Wt Readings from Last 3 Encounters:  09/25/13 173 lb (78.472 kg)     Past Medical History  Diagnosis Date  . Coronary atherosclerosis of native coronary artery     bare-metal stent to the mid and distal RCA 2004 and CB PTCA 2062for restenosis  . HTN (hypertension)   . Hyperlipidemia   . Erectile dysfunction   . Anxiety   . CKD (chronic kidney disease)     Current Outpatient Prescriptions  Medication Sig Dispense Refill  . acetaminophen (TYLENOL) 500 MG tablet Take 500 mg by mouth every 6 (six) hours as needed.      . ALPRAZolam (XANAX) 1 MG tablet Take 1 mg by mouth at bedtime as needed for sleep.      Marland Kitchen aspirin 81 MG tablet Take 81 mg by mouth daily.      . clopidogrel (PLAVIX) 75 MG tablet Take 75 mg by mouth daily.      Marland Kitchen lisinopril (PRINIVIL,ZESTRIL) 20 MG tablet  Take 20 mg by mouth daily.      . Multiple Vitamin (MULTIVITAMIN) capsule Take 1 capsule by mouth daily.      . nitroGLYCERIN (NITROSTAT) 0.4 MG SL tablet Place 0.4 mg under the tongue as needed for chest pain.      . pravastatin (PRAVACHOL) 20 MG tablet Take 20 mg by mouth daily.      . sildenafil (VIAGRA) 100 MG tablet Take 100 mg by mouth daily as needed for erectile dysfunction.       No current facility-administered medications for this visit.    Allergies:    Allergies  Allergen Reactions  . Penicillins     History   Social History  . Marital Status: Married    Spouse Name: N/A    Number of Children: N/A  . Years of Education: N/A   Occupational History  . Not on file.   Social History Main Topics  . Smoking status: Current Every Day Smoker  . Smokeless tobacco: Not on file  . Alcohol Use: Yes  . Drug Use: No  . Sexual Activity: Yes   Other Topics Concern  . Not on file   Social History Narrative  . No narrative on file    ROS:  Please see the history of present illness.   Loquacious. Under stress  taking care of an elderly. Appetite stable. No weight loss. Very sedentary.   All other systems reviewed and negative.   OBJECTIVE: VS:  BP 142/72  Ht 5\' 8"  (1.727 m)  Wt 173 lb (78.472 kg)  BMI 26.31 kg/m2 158/90 mmHg. Also reviewed home recordings which are in the same range predominantly Well nourished, well developed, in no acute distress, appears well HEENT: normal Neck: JVD flat. Carotid bruit without bruit  Cardiac:  normal S1, S2; RRR; no murmur Lungs:  clear to auscultation bilaterally, no wheezing, rhonchi or rales Abd: soft, nontender, no hepatomegaly Ext: Edema absent. Pulses 2+ Skin: warm and dry Neuro:  CNs 2-12 intact, no focal abnormalities noted  EKG:  Inferior Q waves with nonspecific T-wave abnormality.       Signed, Darci Needle III, MD 09/25/2013 3:35 PM

## 2013-09-25 NOTE — Patient Instructions (Signed)
Stop Lisinopril   Start Lisinopril HCTZ 20/12.5mg   Your physician recommends that you return for lab work in: 3 -4 weeks   Your physician recommends that you schedule a follow-up appointment in: 3-4 weeks with the S.Weaver,PA/ or L.Gerhardt,NP for a BP check   Smoking Cessation Quitting smoking is important to your health and has many advantages. However, it is not always easy to quit since nicotine is a very addictive drug. Often times, people try 3 times or more before being able to quit. This document explains the best ways for you to prepare to quit smoking. Quitting takes hard work and a lot of effort, but you can do it. ADVANTAGES OF QUITTING SMOKING  You will live longer, feel better, and live better.  Your body will feel the impact of quitting smoking almost immediately.  Within 20 minutes, blood pressure decreases. Your pulse returns to its normal level.  After 8 hours, carbon monoxide levels in the blood return to normal. Your oxygen level increases.  After 24 hours, the chance of having a heart attack starts to decrease. Your breath, hair, and body stop smelling like smoke.  After 48 hours, damaged nerve endings begin to recover. Your sense of taste and smell improve.  After 72 hours, the body is virtually free of nicotine. Your bronchial tubes relax and breathing becomes easier.  After 2 to 12 weeks, lungs can hold more air. Exercise becomes easier and circulation improves.  The risk of having a heart attack, stroke, cancer, or lung disease is greatly reduced.  After 1 year, the risk of coronary heart disease is cut in half.  After 5 years, the risk of stroke falls to the same as a nonsmoker.  After 10 years, the risk of lung cancer is cut in half and the risk of other cancers decreases significantly.  After 15 years, the risk of coronary heart disease drops, usually to the level of a nonsmoker.  If you are pregnant, quitting smoking will improve your chances of  having a healthy baby.  The people you live with, especially any children, will be healthier.  You will have extra money to spend on things other than cigarettes. QUESTIONS TO THINK ABOUT BEFORE ATTEMPTING TO QUIT You may want to talk about your answers with your caregiver.  Why do you want to quit?  If you tried to quit in the past, what helped and what did not?  What will be the most difficult situations for you after you quit? How will you plan to handle them?  Who can help you through the tough times? Your family? Friends? A caregiver?  What pleasures do you get from smoking? What ways can you still get pleasure if you quit? Here are some questions to ask your caregiver:  How can you help me to be successful at quitting?  What medicine do you think would be best for me and how should I take it?  What should I do if I need more help?  What is smoking withdrawal like? How can I get information on withdrawal? GET READY  Set a quit date.  Change your environment by getting rid of all cigarettes, ashtrays, matches, and lighters in your home, car, or work. Do not let people smoke in your home.  Review your past attempts to quit. Think about what worked and what did not. GET SUPPORT AND ENCOURAGEMENT You have a better chance of being successful if you have help. You can get support in many ways.  Tell your family, friends, and co-workers that you are going to quit and need their support. Ask them not to smoke around you.  Get individual, group, or telephone counseling and support. Programs are available at [employer] and health centers. Call your local health department for information about programs in your area.  Spiritual beliefs and practices may help some smokers quit.  Download a "quit meter" on your computer to keep track of quit statistics, such as how long you have gone without smoking, cigarettes not smoked, and money saved.  Get a self-help book about quitting  smoking and staying off of tobacco. LEARN NEW SKILLS AND BEHAVIORS  Distract yourself from urges to smoke. Talk to someone, go for a walk, or occupy your time with a task.  Change your normal routine. Take a different route to work. Drink tea instead of coffee. Eat breakfast in a different place.  Reduce your stress. Take a hot bath, exercise, or read a book.  Plan something enjoyable to do every day. Reward yourself for not smoking.  Explore interactive web-based programs that specialize in helping you quit. GET MEDICINE AND USE IT CORRECTLY Medicines can help you stop smoking and decrease the urge to smoke. Combining medicine with the above behavioral methods and support can greatly increase your chances of successfully quitting smoking.  Nicotine replacement therapy helps deliver nicotine to your body without the negative effects and risks of smoking. Nicotine replacement therapy includes nicotine gum, lozenges, inhalers, nasal sprays, and skin patches. Some may be available over-the-counter and others require a prescription.  Antidepressant medicine helps people abstain from smoking, but how this works is unknown. This medicine is available by prescription.  Nicotinic receptor partial agonist medicine simulates the effect of nicotine in your brain. This medicine is available by prescription. Ask your caregiver for advice about which medicines to use and how to use them based on your health history. Your caregiver will tell you what side effects to look out for if you choose to be on a medicine or therapy. Carefully read the information on the package. Do not use any other product containing nicotine while using a nicotine replacement product.  RELAPSE OR DIFFICULT SITUATIONS Most relapses occur within the first 3 months after quitting. Do not be discouraged if you start smoking again. Remember, most people try several times before finally quitting. You may have symptoms of withdrawal  because your body is used to nicotine. You may crave cigarettes, be irritable, feel very hungry, cough often, get headaches, or have difficulty concentrating. The withdrawal symptoms are only temporary. They are strongest when you first quit, but they will go away within 10 14 days. To reduce the chances of relapse, try to:  Avoid drinking alcohol. Drinking lowers your chances of successfully quitting.  Reduce the amount of caffeine you consume. Once you quit smoking, the amount of caffeine in your body increases and can give you symptoms, such as a rapid heartbeat, sweating, and anxiety.  Avoid smokers because they can make you want to smoke.  Do not let weight gain distract you. Many smokers will gain weight when they quit, usually less than 10 pounds. Eat a healthy diet and stay active. You can always lose the weight gained after you quit.  Find ways to improve your mood other than smoking. FOR MORE INFORMATION  www.smokefree.gov  Document Released: 11/02/2001 Document Revised: 05/09/2012 Document Reviewed: 02/17/2012 [facility] Patient Information 2014 [place], [place].

## 2013-10-22 ENCOUNTER — Ambulatory Visit (INDEPENDENT_AMBULATORY_CARE_PROVIDER_SITE_OTHER): Payer: Medicare Other | Admitting: Nurse Practitioner

## 2013-10-22 ENCOUNTER — Encounter: Payer: Self-pay | Admitting: Nurse Practitioner

## 2013-10-22 VITALS — BP 120/60 | HR 66 | Ht 69.0 in | Wt 173.8 lb

## 2013-10-22 DIAGNOSIS — I10 Essential (primary) hypertension: Secondary | ICD-10-CM

## 2013-10-22 NOTE — Patient Instructions (Addendum)
Stay on your current medicines  Monitor your blood pressure at home  Try to stop smoking  See Dr. Katrinka Blazing in one year  Call the [facility] office at [phone] if you have any questions, problems or concerns.

## 2013-10-22 NOTE — Progress Notes (Signed)
[name] Date of Birth: 1951/08/24 Medical Record #[id number]  History of Present Illness: [name] is seen back today for a follow up visit. Seen for Dr. Katrinka Davis. He has CAD, HTN, CKD, ED, HLD and ongoing tobacco abuse. Seen earlier in November with elevated BP - switched over to Prinzide 20/12.5. Apparently noted to have a tendency to be tangential and conversational/loquacious.  Comes back today. Here alone. Doing ok. Feels ok on his medicines. BP has improved with the medicine changes. Checking his BP at home and notes that it is improved at home. Seeing Dr. Tiburcio Davis next week with complete labs - does not want to do here today. No chest pain. Still smoking. Says he will continue to smoke as long as his 15 year old mother is demented.    Current Outpatient Prescriptions  Medication Sig Dispense Refill  . acetaminophen (TYLENOL) 500 MG tablet Take 500 mg by mouth every 6 (six) hours as needed.      . ALPRAZolam (XANAX) 1 MG tablet Take 1 mg by mouth at bedtime as needed for sleep.      Marland Kitchen aspirin 81 MG tablet Take 81 mg by mouth daily.      . clopidogrel (PLAVIX) 75 MG tablet Take 75 mg by mouth daily.      Marland Kitchen lisinopril-hydrochlorothiazide (PRINZIDE) 20-12.5 MG per tablet Take 1 tablet by mouth daily.  30 tablet  11  . Multiple Vitamin (MULTIVITAMIN) capsule Take 1 capsule by mouth daily.      . nitroGLYCERIN (NITROSTAT) 0.4 MG SL tablet Place 0.4 mg under the tongue as needed for chest pain.      . pravastatin (PRAVACHOL) 20 MG tablet Take 20 mg by mouth daily.      . sildenafil (VIAGRA) 100 MG tablet Take 100 mg by mouth daily as needed for erectile dysfunction.       No current facility-administered medications for this visit.    Allergies  Allergen Reactions  . Penicillins     Past Medical History  Diagnosis Date  . Coronary atherosclerosis of native coronary artery     bare-metal stent to the mid and distal RCA 2004 and CB PTCA 2024for restenosis  . HTN (hypertension)   .  Hyperlipidemia   . Erectile dysfunction   . Anxiety   . CKD (chronic kidney disease)   . Tobacco abuse     History reviewed. No pertinent past surgical history.  History  Smoking status  . Current Every Day Smoker  Smokeless tobacco  . Not on file    History  Alcohol Use  . Yes    History reviewed. No pertinent family history.  Review of Systems: The review of systems is per the HPI.  All other systems were reviewed and are negative.  Physical Exam: BP 120/60  Pulse 66  Ht 5\' 9"  (1.753 m)  Wt 173 lb 12.8 oz (78.835 kg)  BMI 25.65 kg/m2  SpO2 98% Patient is very pleasant and in no acute distress. Skin is warm and dry. Color is normal.  HEENT is unremarkable. Normocephalic/atraumatic. PERRL. Sclera are nonicteric. Neck is supple. No masses. No JVD. Lungs are clear. Cardiac exam shows a regular rate and rhythm. Abdomen is soft. Extremities are without edema. Gait and ROM are intact. No gross neurologic deficits noted.  LABORATORY DATA: PENDING  Lab Results  Component Value Date   WBC 9.5 06/25/2009   HGB 14.6 06/25/2009   HCT 42.2 06/25/2009   PLT 185 06/25/2009   GLUCOSE 99 06/25/2009  NA 133* 06/25/2009   K 3.4* 06/25/2009   CL 102 06/25/2009   CREATININE 1.05 06/25/2009   BUN 8 06/25/2009   CO2 25 06/25/2009    Assessment / Plan: 1. HTN - BP has improved.  I have left him on his current regimen.   2. HLD - on statin  3. Tobacco abuse - not interested in stopping  4. CAD - no chest pain reported.  See him back in one year. No change in current regimen. Labs per his PCP.  Patient is agreeable to this plan and will call if any problems develop in the interim.   Rosalio Macadamia, RN, ANP-C [facility] [address] [place], [place]  [address]

## 2013-11-01 ENCOUNTER — Encounter: Payer: Self-pay | Admitting: Interventional Cardiology

## 2014-01-24 ENCOUNTER — Other Ambulatory Visit: Payer: Self-pay | Admitting: Interventional Cardiology

## 2014-01-30 ENCOUNTER — Emergency Department (HOSPITAL_COMMUNITY)
Admission: EM | Admit: 2014-01-30 | Discharge: 2014-01-30 | Disposition: A | Discharge status: Home / Self Care | Payer: Medicare Other | Attending: Emergency Medicine | Admitting: Emergency Medicine

## 2014-01-30 ENCOUNTER — Encounter (HOSPITAL_COMMUNITY): Payer: Self-pay | Admitting: Emergency Medicine

## 2014-01-30 ENCOUNTER — Emergency Department (HOSPITAL_COMMUNITY): Payer: Medicare Other

## 2014-01-30 DIAGNOSIS — F411 Generalized anxiety disorder: Secondary | ICD-10-CM | POA: Insufficient documentation

## 2014-01-30 DIAGNOSIS — Z79899 Other long term (current) drug therapy: Secondary | ICD-10-CM | POA: Insufficient documentation

## 2014-01-30 DIAGNOSIS — I2510 Atherosclerotic heart disease of native coronary artery without angina pectoris: Secondary | ICD-10-CM | POA: Insufficient documentation

## 2014-01-30 DIAGNOSIS — R05 Cough: Secondary | ICD-10-CM

## 2014-01-30 DIAGNOSIS — R059 Cough, unspecified: Secondary | ICD-10-CM | POA: Insufficient documentation

## 2014-01-30 DIAGNOSIS — N189 Chronic kidney disease, unspecified: Secondary | ICD-10-CM | POA: Insufficient documentation

## 2014-01-30 DIAGNOSIS — F17200 Nicotine dependence, unspecified, uncomplicated: Secondary | ICD-10-CM | POA: Insufficient documentation

## 2014-01-30 DIAGNOSIS — J3489 Other specified disorders of nose and nasal sinuses: Secondary | ICD-10-CM | POA: Insufficient documentation

## 2014-01-30 DIAGNOSIS — I252 Old myocardial infarction: Secondary | ICD-10-CM | POA: Insufficient documentation

## 2014-01-30 DIAGNOSIS — I129 Hypertensive chronic kidney disease with stage 1 through stage 4 chronic kidney disease, or unspecified chronic kidney disease: Secondary | ICD-10-CM | POA: Insufficient documentation

## 2014-01-30 DIAGNOSIS — E785 Hyperlipidemia, unspecified: Secondary | ICD-10-CM | POA: Insufficient documentation

## 2014-01-30 DIAGNOSIS — Z880 Allergy status to penicillin: Secondary | ICD-10-CM | POA: Insufficient documentation

## 2014-01-30 DIAGNOSIS — N529 Male erectile dysfunction, unspecified: Secondary | ICD-10-CM | POA: Insufficient documentation

## 2014-01-30 DIAGNOSIS — R0602 Shortness of breath: Secondary | ICD-10-CM

## 2014-01-30 DIAGNOSIS — Z7902 Long term (current) use of antithrombotics/antiplatelets: Secondary | ICD-10-CM | POA: Insufficient documentation

## 2014-01-30 LAB — COMPREHENSIVE METABOLIC PANEL
ALK PHOS: 44 U/L (ref 39–117)
ALT: 10 U/L (ref 0–53)
AST: 12 U/L (ref 0–37)
Albumin: 3.6 g/dL (ref 3.5–5.2)
BILIRUBIN TOTAL: 0.3 mg/dL (ref 0.3–1.2)
BUN: 16 mg/dL (ref 6–23)
CO2: 24 mEq/L (ref 19–32)
Calcium: 8.9 mg/dL (ref 8.4–10.5)
Chloride: 95 mEq/L — ABNORMAL LOW (ref 96–112)
Creatinine, Ser: 1.39 mg/dL — ABNORMAL HIGH (ref 0.50–1.35)
GFR, EST AFRICAN AMERICAN: 61 mL/min — AB (ref >90)
GFR, EST NON AFRICAN AMERICAN: 53 mL/min — AB (ref >90)
GLUCOSE: 96 mg/dL (ref 70–99)
POTASSIUM: 4.1 meq/L (ref 3.7–5.3)
SODIUM: 131 meq/L — AB (ref 137–147)
Total Protein: 5.9 g/dL — ABNORMAL LOW (ref 6.0–8.3)

## 2014-01-30 LAB — CBC WITH DIFFERENTIAL/PLATELET
BASOS PCT: 0 % (ref 0–1)
Basophils Absolute: 0 10*3/uL (ref 0.0–0.1)
EOS ABS: 0.3 10*3/uL (ref 0.0–0.7)
EOS PCT: 3 % (ref 0–5)
HCT: 39.5 % (ref 39.0–52.0)
Hemoglobin: 14 g/dL (ref 13.0–17.0)
Lymphocytes Relative: 21 % (ref 12–46)
Lymphs Abs: 1.9 10*3/uL (ref 0.7–4.0)
MCH: 31.3 pg (ref 26.0–34.0)
MCHC: 35.4 g/dL (ref 30.0–36.0)
MCV: 88.2 fL (ref 78.0–100.0)
Monocytes Absolute: 0.9 10*3/uL (ref 0.1–1.0)
Monocytes Relative: 10 % (ref 3–12)
Neutro Abs: 5.9 10*3/uL (ref 1.7–7.7)
Neutrophils Relative %: 66 % (ref 43–77)
PLATELETS: 177 10*3/uL (ref 150–400)
RBC: 4.48 MIL/uL (ref 4.22–5.81)
RDW: 13 % (ref 11.5–15.5)
WBC: 9 10*3/uL (ref 4.0–10.5)

## 2014-01-30 LAB — I-STAT TROPONIN, ED: Troponin i, poc: 0 ng/mL (ref 0.00–0.08)

## 2014-01-30 MED ORDER — GUAIFENESIN 100 MG/5ML PO LIQD: 100.0000 mg | Freq: Four times a day (QID) | ORAL | Status: AC | PRN

## 2014-01-30 NOTE — ED Notes (Signed)
States that he has been in hospital a lot due to family member being upstairs and  This am he started to have severe cough and had some phelm  and then had sob it scared him so he came to er states takes  1 ativan 5 x a day for anxiety and did not flu shot this year family at bedside

## 2014-01-30 NOTE — Discharge Instructions (Signed)
Cough, Adult  A cough is a reflex that helps clear your throat and airways. It can help heal the body or may be a reaction to an irritated airway. A cough may only last 2 or 3 weeks (acute) or may last more than 8 weeks (chronic).  CAUSES Acute cough:  Viral or bacterial infections. Chronic cough:  Infections.  Allergies.  Asthma.  Post-nasal drip.  Smoking.  Heartburn or acid reflux.  Some medicines.  Chronic lung problems (COPD).  Cancer. SYMPTOMS   Cough.  Fever.  Chest pain.  Increased breathing rate.  High-pitched whistling sound when breathing (wheezing).  Colored mucus that you cough up (sputum). TREATMENT   A bacterial cough may be treated with antibiotic medicine.  A viral cough must run its course and will not respond to antibiotics.  Your caregiver may recommend other treatments if you have a chronic cough. HOME CARE INSTRUCTIONS   Only take over-the-counter or prescription medicines for pain, discomfort, or fever as directed by your caregiver. Use cough suppressants only as directed by your caregiver.  Use a cold steam vaporizer or humidifier in your bedroom or home to help loosen secretions.  Sleep in a semi-upright position if your cough is worse at night.  Rest as needed.  Stop smoking if you smoke. SEEK IMMEDIATE MEDICAL CARE IF:   You have pus in your sputum.  Your cough starts to worsen.  You cannot control your cough with suppressants and are losing sleep.  You begin coughing up blood.  You have difficulty breathing.  You develop pain which is getting worse or is uncontrolled with medicine.  You have a fever. MAKE SURE YOU:   Understand these instructions.  Will watch your condition.  Will get help right away if you are not doing well or get worse. Document Released: 05/07/2011 Document Revised: 01/31/2012 Document Reviewed: 05/07/2011 [facility] Patient Information 2014 [place], [place].  Shortness of Breath Shortness  of breath means you have trouble breathing. Shortness of breath may indicate that you have a medical problem. You should seek immediate medical care for shortness of breath. CAUSES   Not enough oxygen in the air (as with high altitudes or a smoke-filled room).  Short-term (acute) lung disease, including:  Infections, such as pneumonia.  Fluid in the lungs, such as heart failure.  A blood clot in the lungs (pulmonary embolism).  Long-term (chronic) lung diseases.  Heart disease (heart attack, angina, heart failure, and others).  Low red blood cells (anemia).  Poor physical fitness. This can cause shortness of breath when you exercise.  Chest or back injuries or stiffness.  Being overweight.  Smoking.  Anxiety. This can make you feel like you are not getting enough air. DIAGNOSIS  Serious medical problems can usually be found during your physical exam. Tests may also be done to determine why you are having shortness of breath. Tests may include:  Chest X-rays.  Lung function tests.  Blood tests.  Electrocardiography.  Exercise testing.  Echocardiography.  Imaging scans. Your caregiver may not be able to find a cause for your shortness of breath after your exam. In this case, it is important to have a follow-up exam with your caregiver as directed.  TREATMENT  Treatment for shortness of breath depends on the cause of your symptoms and can vary greatly. HOME CARE INSTRUCTIONS   Do not smoke. Smoking is a common cause of shortness of breath. If you smoke, ask for help to quit.  Avoid being around chemicals or things  that may bother your breathing, such as paint fumes and dust.  Rest as needed. Slowly resume your usual activities.  If medicines were prescribed, take them as directed for the full length of time directed. This includes oxygen and any inhaled medicines.  Keep all follow-up appointments as directed by your caregiver. SEEK MEDICAL CARE IF:   Your  condition does not improve in the time expected.  You have a hard time doing your normal activities even with rest.  You have any side effects or problems with the medicines prescribed.  You develop any new symptoms. SEEK IMMEDIATE MEDICAL CARE IF:   Your shortness of breath gets worse.  You feel lightheaded, faint, or develop a cough not controlled with medicines.  You start coughing up blood.  You have pain with breathing.  You have chest pain or pain in your arms, shoulders, or abdomen.  You have a fever.  You are unable to walk up stairs or exercise the way you normally do. MAKE SURE YOU:  Understand these instructions.  Will watch your condition.  Will get help right away if you are not doing well or get worse. Document Released: 08/03/2001 Document Revised: 05/09/2012 Document Reviewed: 01/24/2012 [facility] Patient Information 2014 [place]

## 2014-01-30 NOTE — ED Notes (Signed)
Pt to department complaining of SOB that started this morning while he was getting ready for work. States that he was red in the face and unable to catch his breath. Reports cardiac hx with 3 stents.

## 2014-01-30 NOTE — ED Provider Notes (Signed)
CSN: [id number]     Arrival date & time 01/30/14  4098 History   First MD Initiated Contact with Patient 01/30/14 [phone]     Chief Complaint  Patient presents with  . Shortness of Breath     (Consider location/radiation/quality/duration/timing/severity/associated sxs/prior Treatment) Patient is a 63 y.o. male presenting with shortness of breath.  Shortness of Breath Severity:  Severe Onset quality:  Sudden Duration: A few minutes. Timing:  Constant Progression:  Partially resolved Chronicity:  New Context comment:  Developed a cough last night. Stated that he began coughing up some phlegm which caused him to be unable to catch his breath. Relieved by:  Lying down Worsened by:  Nothing tried Associated symptoms: cough (Productive of light yellow sputum)   Associated symptoms: no abdominal pain, no chest pain, no fever, no hemoptysis, no syncope and no vomiting     Past Medical History  Diagnosis Date  . Coronary atherosclerosis of native coronary artery     bare-metal stent to the mid and distal RCA 2004 and CB PTCA 2059for restenosis  . HTN (hypertension)   . Hyperlipidemia   . Erectile dysfunction   . Anxiety   . CKD (chronic kidney disease)   . Tobacco abuse    History reviewed. No pertinent past surgical history. History reviewed. No pertinent family history. History  Substance Use Topics  . Smoking status: Current Every Day Smoker  . Smokeless tobacco: Not on file  . Alcohol Use: Yes    Review of Systems  Constitutional: Negative for fever.  HENT: Positive for congestion.   Respiratory: Positive for cough (Productive of light yellow sputum) and shortness of breath. Negative for hemoptysis.   Cardiovascular: Negative for chest pain and syncope.  Gastrointestinal: Negative for nausea, vomiting, abdominal pain and diarrhea.  All other systems reviewed and are negative.      Allergies  Penicillins  Home Medications   Current Outpatient Rx  Name  Route  Sig   Dispense  Refill  . acetaminophen (TYLENOL) 500 MG tablet   Oral   Take 500 mg by mouth every 6 (six) hours as needed for mild pain, moderate pain or fever.          . ALPRAZolam (XANAX) 1 MG tablet   Oral   Take 1 mg by mouth 5 (five) times daily.          Marland Kitchen aspirin 81 MG tablet   Oral   Take 81 mg by mouth daily.         . clopidogrel (PLAVIX) 75 MG tablet      TAKE 1 TABLET BY MOUTH ONCE DAILY   90 tablet   0   . lisinopril (PRINIVIL,ZESTRIL) 20 MG tablet   Oral   Take 20 mg by mouth daily.         . Menthol-Zinc Oxide (GOLD BOND EX)   Apply externally   Apply 1 application topically daily.         . Multiple Vitamin (MULTIVITAMIN) capsule   Oral   Take 1 capsule by mouth daily.         . nitroGLYCERIN (NITROSTAT) 0.4 MG SL tablet   Sublingual   Place 0.4 mg under the tongue as needed for chest pain.         . pravastatin (PRAVACHOL) 20 MG tablet   Oral   Take 20 mg by mouth daily.         . sildenafil (VIAGRA) 100 MG tablet   Oral  Take 100 mg by mouth daily as needed for erectile dysfunction.         Marland Kitchen. lisinopril-hydrochlorothiazide (PRINZIDE) 20-12.5 MG per tablet   Oral   Take 1 tablet by mouth daily.   30 tablet   11    BP 113/72  Pulse 88  Temp(Src) 98.2 F (36.8 C) (Oral)  Resp 18  Ht 5\' 8"  (1.727 m)  Wt 168 lb (76.204 kg)  BMI 25.55 kg/m2  SpO2 98% Physical Exam  Nursing note and vitals reviewed. Constitutional: He is oriented to person, place, and time. He appears well-developed and well-nourished. No distress.  HENT:  Head: Normocephalic and atraumatic.  Mouth/Throat: Oropharynx is clear and moist.  Eyes: Conjunctivae are normal. Pupils are equal, round, and reactive to light. No scleral icterus.  Neck: Neck supple.  Cardiovascular: Normal rate, regular rhythm, normal heart sounds and intact distal pulses.   No murmur heard. Pulmonary/Chest: Effort normal and breath sounds normal. No stridor. No respiratory  distress. He has no wheezes. He has no rales.  Abdominal: Soft. He exhibits no distension. There is no tenderness.  Musculoskeletal: Normal range of motion. He exhibits no edema.  Neurological: He is alert and oriented to person, place, and time.  Skin: Skin is warm and dry. No rash noted.  Psychiatric: He has a normal mood and affect. His behavior is normal.    ED Course  Procedures (including critical care time) Labs Review Labs Reviewed  COMPREHENSIVE METABOLIC PANEL - Abnormal; Notable for the following:    Sodium 131 (*)    Chloride 95 (*)    Creatinine, Ser 1.39 (*)    Total Protein 5.9 (*)    GFR calc non Af Amer 53 (*)    GFR calc Af Amer 61 (*)    All other components within normal limits  CBC WITH DIFFERENTIAL  I-STAT TROPOININ, ED   Imaging Review Dg Chest 2 View  01/30/2014   CLINICAL DATA Shortness of breath and cough  EXAM CHEST  2 VIEW  COMPARISON 12/01/2010  FINDINGS Cardiac shadow is within normal limits. The lungs are well aerated bilaterally. Nipple shadow is noted overlying the right lung base. It is stable from the prior exam. No new focal abnormality is seen.  IMPRESSION No active cardiopulmonary disease.  SIGNATURE  Electronically Signed   By: Alcide CleverMark  Lukens M.D.   On: 01/30/2014 10:16  All radiology studies independently viewed by me.      EKG Interpretation   Date/Time:  Wednesday January 30 2014 09:28:49 EDT Ventricular Rate:  80 PR Interval:  186 QRS Duration: 96 QT Interval:  374 QTC Calculation: 431 R Axis:   88 Text Interpretation:  Sinus rhythm with occasional Premature ventricular  complexes Otherwise normal ECG compared to prior, PVC now present  Confirmed by [facility]  MD, TREY ([id number]) on 01/30/2014 10:32:59 AM      MDM   Final diagnoses:  Cough  Shortness of breath    63 year old male who had acute shortness of breath during a coughing fit. On my exam, well appearing, nontoxic, no respiratory distress, normal vitals. He has a history of  coronary artery disease, but this episode is not consistent with ACS and is in no way similar to his prior MI. His symptoms are also not consistent with PE (and he is not tachycardic, not hypoxic, without increased work of breathing.)  Thank you shortness of breath was likely secondary to it in acute coughing fit and choking on phlegm.  Will recommend guaifenesin  and close PCP follow up.  Also advised to drink plenty of fluids.   Candyce Churn III, MD 01/30/14 [phone]

## 2014-04-29 ENCOUNTER — Telehealth: Payer: Self-pay | Admitting: Interventional Cardiology

## 2014-04-29 NOTE — Telephone Encounter (Signed)
Follow up  ° ° ° °Returning call back to nurse  °

## 2014-04-29 NOTE — Telephone Encounter (Signed)
returned pt call.lmom pt should take plavix as prescribed , until instructed by a physician. I will fwd the message to Dr.Smith

## 2014-04-29 NOTE — Telephone Encounter (Signed)
Routed to Dr. Smith  

## 2014-04-29 NOTE — Telephone Encounter (Signed)
New message      Pt want Dr Katrinka Blazing to know he is now taking 1/2 plavix daily because he is bruising a lot.

## 2014-04-30 NOTE — Telephone Encounter (Signed)
Stop plavix and continue aspirin.

## 2014-04-30 NOTE — Telephone Encounter (Signed)
pt given Dr.Smith ok to stop Plavix and continue Asa.pt verbalized understanding.

## 2014-05-02 ENCOUNTER — Encounter: Payer: Self-pay | Admitting: Interventional Cardiology

## 2014-09-18 ENCOUNTER — Other Ambulatory Visit: Payer: Self-pay | Admitting: Interventional Cardiology

## 2014-11-19 ENCOUNTER — Other Ambulatory Visit: Payer: Self-pay | Admitting: Interventional Cardiology

## 2014-12-25 ENCOUNTER — Other Ambulatory Visit: Payer: Self-pay | Admitting: Interventional Cardiology

## 2015-01-22 ENCOUNTER — Other Ambulatory Visit: Payer: Self-pay | Admitting: Interventional Cardiology

## 2015-01-23 NOTE — Telephone Encounter (Signed)
Please advise on refill. Patient still has failed to schedule an appointment. Thanks, MI 

## 2015-01-24 ENCOUNTER — Other Ambulatory Visit: Payer: Self-pay

## 2015-01-24 MED ORDER — LISINOPRIL-HYDROCHLOROTHIAZIDE 20-12.5 MG PO TABS: 1.0000 | ORAL_TABLET | Freq: Every day | ORAL | Status: DC

## 2015-02-20 ENCOUNTER — Other Ambulatory Visit: Payer: Self-pay | Admitting: Interventional Cardiology

## 2015-02-26 ENCOUNTER — Other Ambulatory Visit: Payer: Self-pay

## 2015-02-26 ENCOUNTER — Telehealth: Payer: Self-pay

## 2015-02-26 MED ORDER — LISINOPRIL 20 MG PO TABS: 20.0000 mg | ORAL_TABLET | Freq: Every day | ORAL | Status: DC

## 2015-02-26 MED ORDER — LISINOPRIL-HYDROCHLOROTHIAZIDE 20-12.5 MG PO TABS: 1.0000 | ORAL_TABLET | Freq: Every day | ORAL | Status: DC

## 2015-02-26 NOTE — Telephone Encounter (Signed)
LMTCO.

## 2015-05-01 ENCOUNTER — Encounter: Payer: Self-pay | Admitting: *Deleted

## 2015-05-05 ENCOUNTER — Ambulatory Visit (INDEPENDENT_AMBULATORY_CARE_PROVIDER_SITE_OTHER): Payer: Medicare Other | Admitting: Interventional Cardiology

## 2015-05-05 ENCOUNTER — Encounter: Payer: Self-pay | Admitting: Interventional Cardiology

## 2015-05-05 VITALS — BP 100/62 | HR 69 | Ht 68.0 in | Wt 170.0 lb

## 2015-05-05 DIAGNOSIS — I2510 Atherosclerotic heart disease of native coronary artery without angina pectoris: Secondary | ICD-10-CM | POA: Diagnosis not present

## 2015-05-05 DIAGNOSIS — E785 Hyperlipidemia, unspecified: Secondary | ICD-10-CM | POA: Diagnosis not present

## 2015-05-05 DIAGNOSIS — I10 Essential (primary) hypertension: Secondary | ICD-10-CM

## 2015-05-05 DIAGNOSIS — N183 Chronic kidney disease, stage 3 unspecified: Secondary | ICD-10-CM

## 2015-05-05 DIAGNOSIS — Z720 Tobacco use: Secondary | ICD-10-CM | POA: Diagnosis not present

## 2015-05-05 NOTE — Patient Instructions (Addendum)
Medication Instructions:  Your physician recommends that you continue on your current medications as directed. Please refer to the Current Medication list given to you today.   Labwork: NONE  Testing/Procedures: NONE  Follow-Up: Please call us when you have established a new Primary Care Provider with their information and we will send your records to their office.   Any Other Special Instructions Will Be Listed Below (If Applicable).  Dr. Katrinka Blazing recommends that you increase your activity.   Smoking Cessation Quitting smoking is important to your health and has many advantages. However, it is not always easy to quit since nicotine is a very addictive drug. Oftentimes, people try 3 times or more before being able to quit. This document explains the best ways for you to prepare to quit smoking. Quitting takes hard work and a lot of effort, but you can do it. ADVANTAGES OF QUITTING SMOKING  You will live longer, feel better, and live better.  Your body will feel the impact of quitting smoking almost immediately.  Within 20 minutes, blood pressure decreases. Your pulse returns to its normal level.  After 8 hours, carbon monoxide levels in the blood return to normal. Your oxygen level increases.  After 24 hours, the chance of having a heart attack starts to decrease. Your breath, hair, and body stop smelling like smoke.  After 48 hours, damaged nerve endings begin to recover. Your sense of taste and smell improve.  After 72 hours, the body is virtually free of nicotine. Your bronchial tubes relax and breathing becomes easier.  After 2 to 12 weeks, lungs can hold more air. Exercise becomes easier and circulation improves.  The risk of having a heart attack, stroke, cancer, or lung disease is greatly reduced.  After 1 year, the risk of coronary heart disease is cut in half.  After 5 years, the risk of stroke falls to the same as a nonsmoker.  After 10 years, the risk of lung cancer  is cut in half and the risk of other cancers decreases significantly.  After 15 years, the risk of coronary heart disease drops, usually to the level of a nonsmoker.  If you are pregnant, quitting smoking will improve your chances of having a healthy baby.  The people you live with, especially any children, will be healthier.  You will have extra money to spend on things other than cigarettes. QUESTIONS TO THINK ABOUT BEFORE ATTEMPTING TO QUIT You may want to talk about your answers with your health care provider.  Why do you want to quit?  If you tried to quit in the past, what helped and what did not?  What will be the most difficult situations for you after you quit? How will you plan to handle them?  Who can help you through the tough times? Your family? Friends? A health care provider?  What pleasures do you get from smoking? What ways can you still get pleasure if you quit? Here are some questions to ask your health care provider:  How can you help me to be successful at quitting?  What medicine do you think would be best for me and how should I take it?  What should I do if I need more help?  What is smoking withdrawal like? How can I get information on withdrawal? GET READY  Set a quit date.  Change your environment by getting rid of all cigarettes, ashtrays, matches, and lighters in your home, car, or work. Do not let people smoke in your  home.  Review your past attempts to quit. Think about what worked and what did not. GET SUPPORT AND ENCOURAGEMENT You have a better chance of being successful if you have help. You can get support in many ways.  Tell your family, friends, and coworkers that you are going to quit and need their support. Ask them not to smoke around you.  Get individual, group, or telephone counseling and support. Programs are available at [employer] and health centers. Call your local health department for information about programs in your  area.  Spiritual beliefs and practices may help some smokers quit.  Download a "quit meter" on your computer to keep track of quit statistics, such as how long you have gone without smoking, cigarettes not smoked, and money saved.  Get a self-help book about quitting smoking and staying off tobacco. LEARN NEW SKILLS AND BEHAVIORS  Distract yourself from urges to smoke. Talk to someone, go for a walk, or occupy your time with a task.  Change your normal routine. Take a different route to work. Drink tea instead of coffee. Eat breakfast in a different place.  Reduce your stress. Take a hot bath, exercise, or read a book.  Plan something enjoyable to do every day. Reward yourself for not smoking.  Explore interactive web-based programs that specialize in helping you quit. GET MEDICINE AND USE IT CORRECTLY Medicines can help you stop smoking and decrease the urge to smoke. Combining medicine with the above behavioral methods and support can greatly increase your chances of successfully quitting smoking.  Nicotine replacement therapy helps deliver nicotine to your body without the negative effects and risks of smoking. Nicotine replacement therapy includes nicotine gum, lozenges, inhalers, nasal sprays, and skin patches. Some may be available over-the-counter and others require a prescription.  Antidepressant medicine helps people abstain from smoking, but how this works is unknown. This medicine is available by prescription.  Nicotinic receptor partial agonist medicine simulates the effect of nicotine in your brain. This medicine is available by prescription. Ask your health care provider for advice about which medicines to use and how to use them based on your health history. Your health care provider will tell you what side effects to look out for if you choose to be on a medicine or therapy. Carefully read the information on the package. Do not use any other product containing nicotine while  using a nicotine replacement product.  RELAPSE OR DIFFICULT SITUATIONS Most relapses occur within the first 3 months after quitting. Do not be discouraged if you start smoking again. Remember, most people try several times before finally quitting. You may have symptoms of withdrawal because your body is used to nicotine. You may crave cigarettes, be irritable, feel very hungry, cough often, get headaches, or have difficulty concentrating. The withdrawal symptoms are only temporary. They are strongest when you first quit, but they will go away within 10-14 days. To reduce the chances of relapse, try to:  Avoid drinking alcohol. Drinking lowers your chances of successfully quitting.  Reduce the amount of caffeine you consume. Once you quit smoking, the amount of caffeine in your body increases and can give you symptoms, such as a rapid heartbeat, sweating, and anxiety.  Avoid smokers because they can make you want to smoke.  Do not let weight gain distract you. Many smokers will gain weight when they quit, usually less than 10 pounds. Eat a healthy diet and stay active. You can always lose the weight gained after you quit.  Find ways to improve your mood other than smoking. FOR MORE INFORMATION  www.smokefree.gov  Document Released: 11/02/2001 Document Revised: 03/25/2014 Document Reviewed: 02/17/2012 [facility] Patient Information 2015 [place], [place]. This information is not intended to replace advice given to you by your health care provider. Make sure you discuss any questions you have with your health care provider.

## 2015-05-05 NOTE — Progress Notes (Signed)
Cardiology Office Note   Date:  05/05/2015   ID:  [name], DOB 02/08/1951, MRN [id number]  PCP:  Johny Blamer, MD  Cardiologist:  Lesleigh Noe, MD   Chief Complaint  Patient presents with  . Follow-up    coronary atherosclerosis      History of Present Illness: [name] is a 64 y.o. male who presents for coronary artery disease with prior bare-metal stent RCA and cutting balloon angioplasty for ISR 2006, hypertension, hyperlipidemia, and chronic kidney disease.  [name] He denies angina. He remains active. He walks several times per week. He continues to smoke cigarettes. He has not needed to use sublingual nitroglycerin.      Past Medical History  Diagnosis Date  . Coronary atherosclerosis of native coronary artery     bare-metal stent to the mid and distal RCA 2004 and CB PTCA 2048for restenosis  . HTN (hypertension)   . Hyperlipidemia   . Erectile dysfunction   . Anxiety   . CKD (chronic kidney disease)   . Tobacco abuse     History reviewed. No pertinent past surgical history.   Current Outpatient Prescriptions  Medication Sig Dispense Refill  . acetaminophen (TYLENOL) 500 MG tablet Take 500 mg by mouth every 6 (six) hours as needed for mild pain, moderate pain or fever.     . ALPRAZolam (XANAX) 1 MG tablet Take 1 mg by mouth 5 (five) times daily.     Marland Kitchen aspirin 81 MG tablet Take 81 mg by mouth daily.    Marland Kitchen guaiFENesin (ROBITUSSIN) 100 MG/5ML liquid Take 5-10 mLs (100-200 mg total) by mouth 4 (four) times daily as needed for cough or congestion. 60 mL 0  . lisinopril-hydrochlorothiazide (PRINZIDE,ZESTORETIC) 20-12.5 MG per tablet Take 1 tablet by mouth daily. 30 tablet 3  . Menthol-Zinc Oxide (GOLD BOND EX) Apply 1 application topically daily.    . Multiple Vitamin (MULTIVITAMIN) capsule Take 1 capsule by mouth daily.    . nitroGLYCERIN (NITROSTAT) 0.4 MG SL tablet  Place 0.4 mg under the tongue as needed for chest pain.    . pravastatin (PRAVACHOL) 20 MG tablet Take 20 mg by mouth daily.    . sildenafil (VIAGRA) 100 MG tablet Take 100 mg by mouth daily as needed for erectile dysfunction.    . traZODone (DESYREL) 50 MG tablet Take 50 mg by mouth as needed. For insomnia  6   No current facility-administered medications for this visit.    Allergies:   Penicillins    Social History:  The patient  reports that he has been smoking.  He does not have any smokeless tobacco history on file. He reports that he drinks alcohol. He reports that he does not use illicit drugs.   Family History:  The patient's family history includes Dementia in his mother; Heart attack in his father.    ROS:  Please see the history of present illness.   Otherwise, review of systems are positive for shortness of breath with exertion, hearing loss, snoring, easy bruising despite discontinuing Plavix, and occasional dizziness. Overall not if his symptoms are limiting..   All other systems are reviewed and negative.    PHYSICAL EXAM: VS:  Ht  (1.727 m)  Wt 77.111 kg (170 lb)  BMI 25.85 kg/m2 , BMI Body mass index is 25.85 kg/(m^2). GEN: Well nourished, well developed, in no acute distress HEENT: normal Neck: no  JVD, carotid bruits, or masses Cardiac: RRR; no murmurs, rubs, or gallops,no edema  Respiratory:  clear to auscultation bilaterally, normal work of breathing GI: soft, nontender, nondistended, + BS MS: no deformity or atrophy Skin: warm and dry, no rash Neuro:  Strength and sensation are intact Psych: euthymic mood, full affect   EKG:  EKG is ordered today. The ekg ordered today demonstrates normal   Recent Labs: No results found for requested labs within last 365 days.    Lipid Panel No results found for: CHOL, TRIG, HDL, CHOLHDL, VLDL, LDLCALC, LDLDIRECT    Wt Readings from Last 3 Encounters:  05/05/15 77.111 kg (170 lb)  01/30/14 76.204 kg (168 lb)    10/22/13 78.835 kg (173 lb 12.8 oz)      Other studies Reviewed: Additional studies/ records that were reviewed today include: . Review of the above records demonstrates: Reviewed laboratory data from Dr. Leonides Sake and most recent creatinine is 1.45 potassium 4.3 sodium 133 liver panel is unremarkable and last lipid panel revealed an LDL of 83   ASSESSMENT AND PLAN:  Atherosclerosis of native coronary artery of native heart without angina pectoris-  controlled  Essential hypertension, benign -controlled  Tobacco abuse - continues to smoke sporadically and depending upon psychological issues.  Hyperlipidemia - stable within the past year with LDL less than 70.  CKD (chronic kidney disease) stage 3, GFR 30-59 ml/min     Current medicines are reviewed at length with the patient today.  The patient does not have concerns regarding medicines.  The following changes have been made:  no change. Encouraged tobacco smoking cessation  Labs/ tests ordered today include:  No orders of the defined types were placed in this encounter.     Disposition:   FU with HS  in PRN  . [place] We will send information to his newly establish primary care and cardiologist..  Signed, Lesleigh Noe, MD  05/05/2015 8:41 AM    [facility] [address] [place], [place], [place]  [address] Phone: [phone]; Fax: [phone]

## 2015-05-15 ENCOUNTER — Other Ambulatory Visit: Payer: Self-pay

## 2015-05-15 ENCOUNTER — Other Ambulatory Visit: Payer: Self-pay | Admitting: Interventional Cardiology

## 2015-05-15 MED ORDER — PRAVASTATIN SODIUM 20 MG PO TABS: 20.0000 mg | ORAL_TABLET | Freq: Every day | ORAL | Status: AC

## 2015-06-17 ENCOUNTER — Other Ambulatory Visit: Payer: Self-pay | Admitting: Interventional Cardiology

## 2015-08-28 IMAGING — CR DG CHEST 2V
2 series · 2 of 2 positions shown · non-contrast
Comparison: none

[w chest pa]
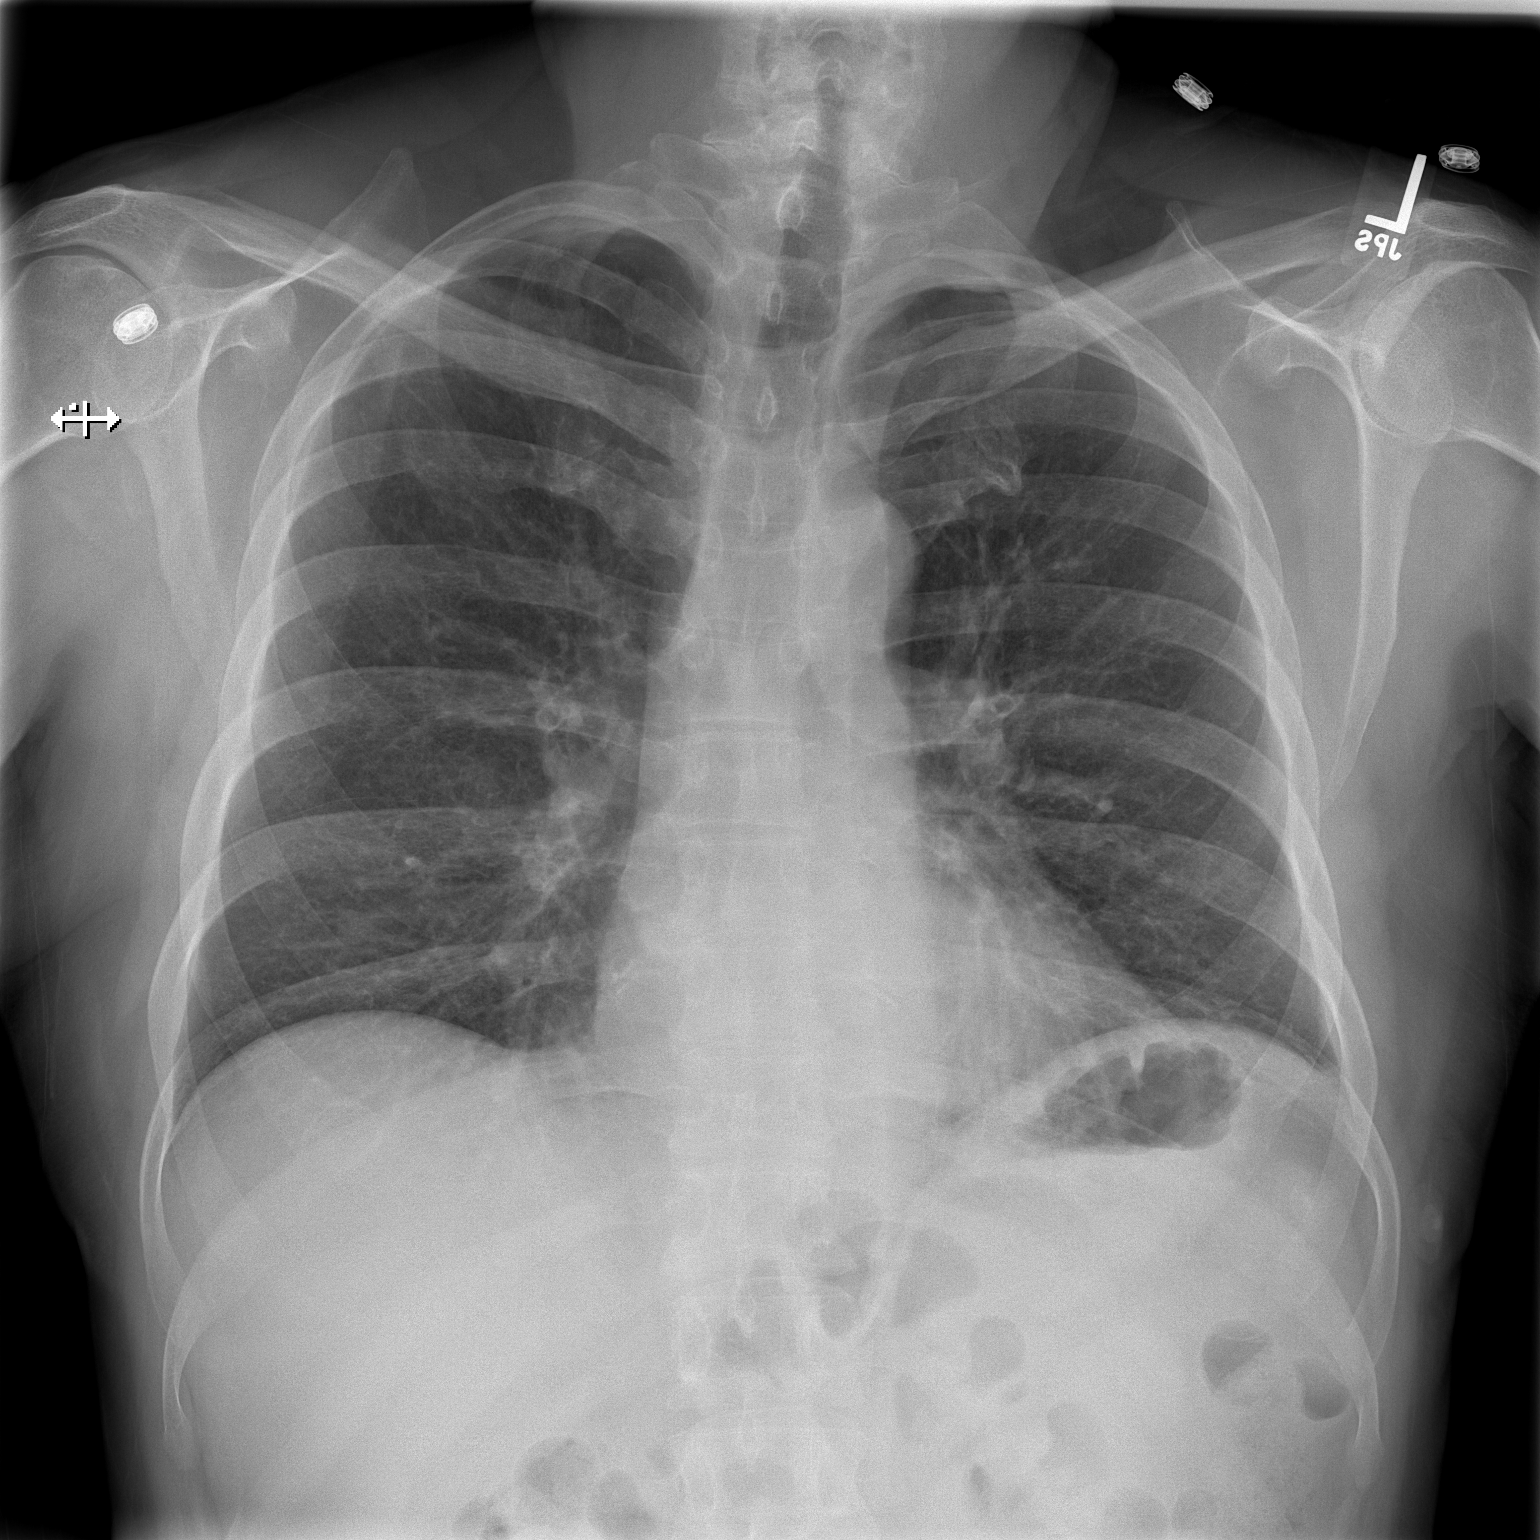

[w chest lat]
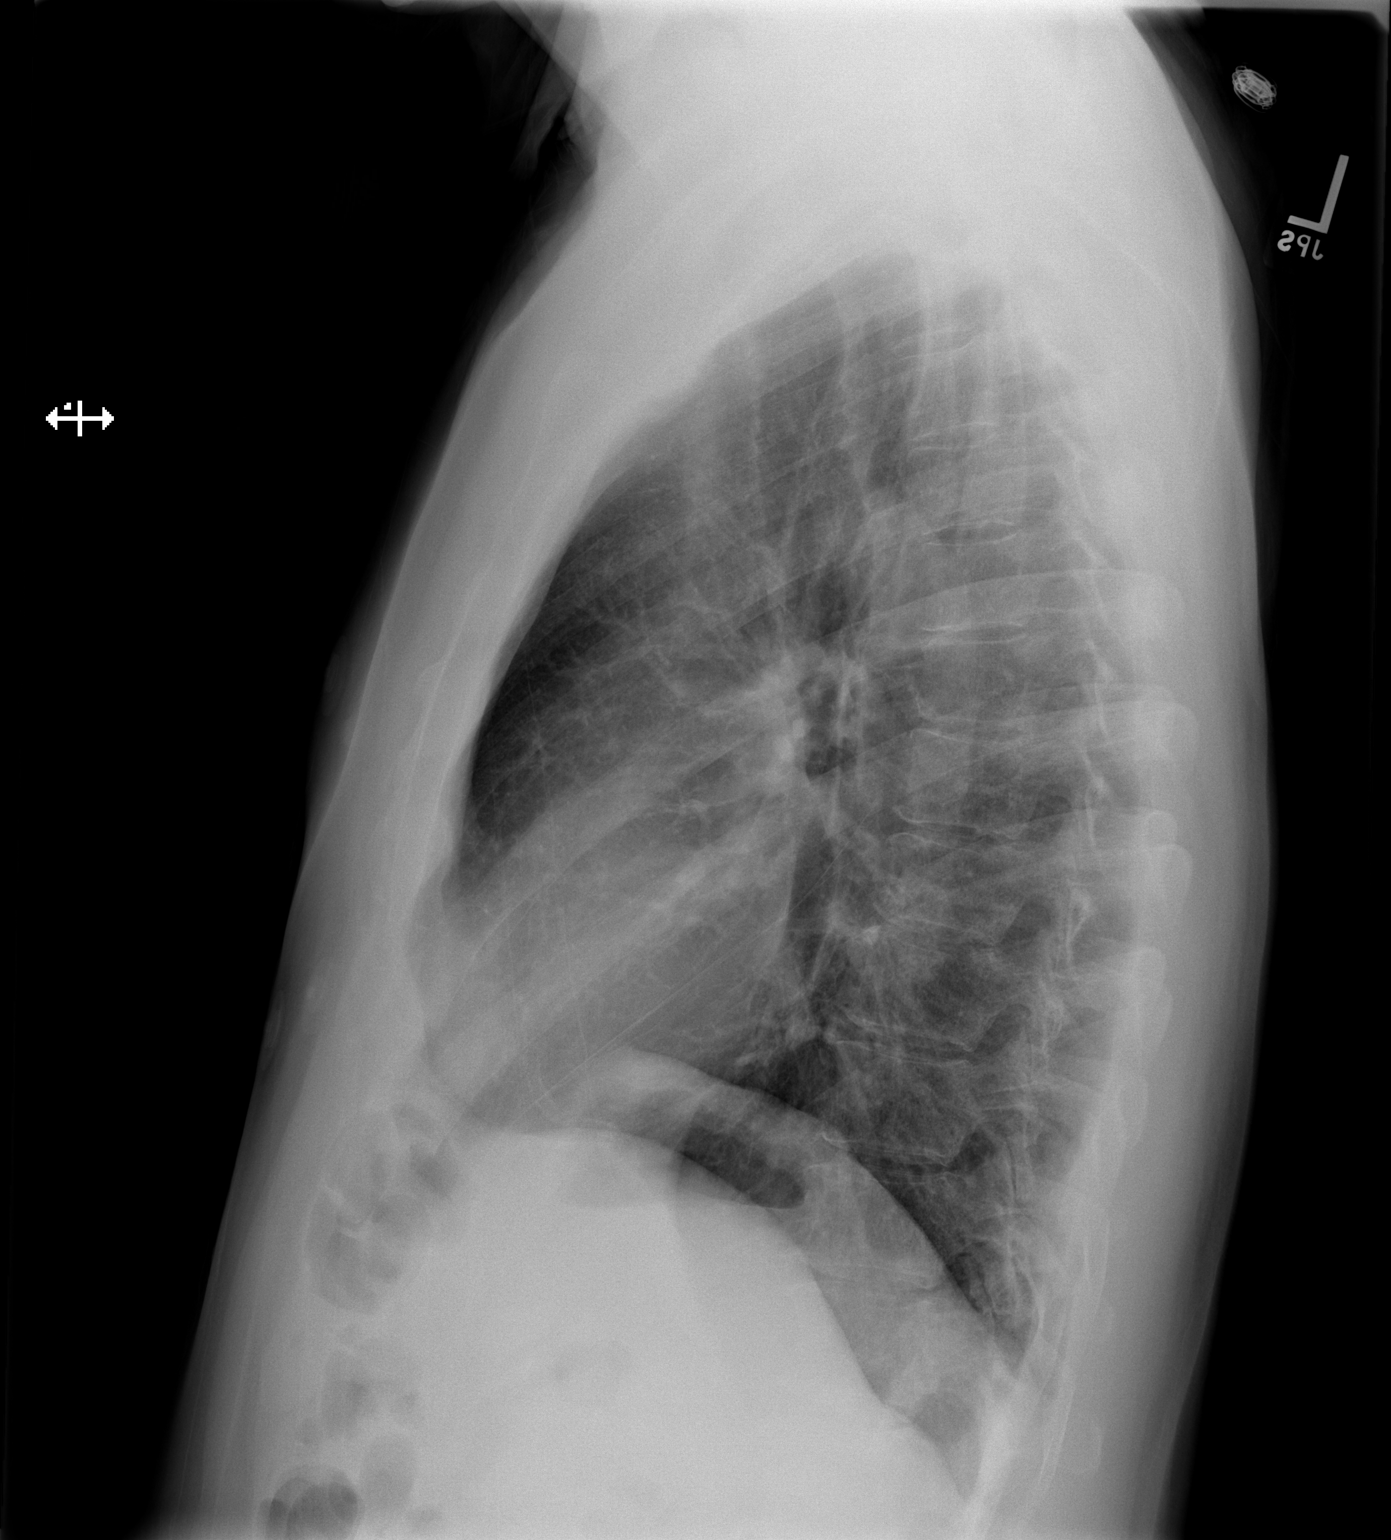

[2 of 2 positions shown; findings below may reference images not displayed]

CLINICAL DATA
Shortness of breath and cough

EXAM
CHEST  2 VIEW

COMPARISON
12/01/2010

FINDINGS
Cardiac shadow is within normal limits. The lungs are well aerated
bilaterally. Nipple shadow is noted overlying the right lung base.
It is stable from the prior exam. No new focal abnormality is seen.

IMPRESSION
No active cardiopulmonary disease.

SIGNATURE

## 2015-09-09 ENCOUNTER — Other Ambulatory Visit: Payer: Self-pay | Admitting: *Deleted

## 2015-09-09 ENCOUNTER — Telehealth: Payer: Self-pay | Admitting: Interventional Cardiology

## 2015-09-09 MED ORDER — NITROGLYCERIN 0.4 MG SL SUBL: 0.4000 mg | SUBLINGUAL_TABLET | SUBLINGUAL | Status: AC | PRN

## 2015-09-09 NOTE — Telephone Encounter (Signed)
New message    Patient calling    [place]. The [place]   STAT if patient is at the pharmacy , call can be transferred to refill team.   1. Which medications need to be refilled? Nitrostat 0.4mg    2. Which pharmacy/location is medication to be sent to? [facility] in [place]  [place]  [phone]   3. Do they need a 30 day or 90 day supply? 30 days
